# Patient Record
Sex: Female | Born: 1990
Health system: Southern US, Community
[De-identification: ages and names within clinical notes are randomized; demographics above are authoritative.]

## PROBLEM LIST (undated history)

## (undated) DIAGNOSIS — F32A Depression, unspecified: Secondary | ICD-10-CM

## (undated) DIAGNOSIS — F988 Other specified behavioral and emotional disorders with onset usually occurring in childhood and adolescence: Secondary | ICD-10-CM

## (undated) DIAGNOSIS — F329 Major depressive disorder, single episode, unspecified: Secondary | ICD-10-CM

## (undated) HISTORY — PX: DILATION AND CURETTAGE OF UTERUS: SHX78

## (undated) HISTORY — PX: WISDOM TOOTH EXTRACTION: SHX21

## (undated) HISTORY — DX: Other specified behavioral and emotional disorders with onset usually occurring in childhood and adolescence: F98.8

---

## 2011-07-24 ENCOUNTER — Emergency Department (INDEPENDENT_AMBULATORY_CARE_PROVIDER_SITE_OTHER): Payer: BC Managed Care – PPO

## 2011-07-24 ENCOUNTER — Emergency Department (HOSPITAL_BASED_OUTPATIENT_CLINIC_OR_DEPARTMENT_OTHER)
Admission: EM | Admit: 2011-07-24 | Discharge: 2011-07-24 | Disposition: A | Discharge status: Home / Self Care | Payer: BC Managed Care – PPO | Attending: Emergency Medicine | Admitting: Emergency Medicine

## 2011-07-24 ENCOUNTER — Emergency Department (HOSPITAL_BASED_OUTPATIENT_CLINIC_OR_DEPARTMENT_OTHER): Payer: BC Managed Care – PPO

## 2011-07-24 ENCOUNTER — Encounter: Payer: Self-pay | Admitting: *Deleted

## 2011-07-24 DIAGNOSIS — W19XXXA Unspecified fall, initial encounter: Secondary | ICD-10-CM

## 2011-07-24 DIAGNOSIS — J45909 Unspecified asthma, uncomplicated: Secondary | ICD-10-CM | POA: Insufficient documentation

## 2011-07-24 DIAGNOSIS — M25579 Pain in unspecified ankle and joints of unspecified foot: Secondary | ICD-10-CM

## 2011-07-24 DIAGNOSIS — S93409A Sprain of unspecified ligament of unspecified ankle, initial encounter: Secondary | ICD-10-CM | POA: Insufficient documentation

## 2011-07-24 DIAGNOSIS — Y92009 Unspecified place in unspecified non-institutional (private) residence as the place of occurrence of the external cause: Secondary | ICD-10-CM | POA: Insufficient documentation

## 2011-07-24 DIAGNOSIS — M79609 Pain in unspecified limb: Secondary | ICD-10-CM

## 2011-07-24 DIAGNOSIS — F17200 Nicotine dependence, unspecified, uncomplicated: Secondary | ICD-10-CM | POA: Insufficient documentation

## 2011-07-24 MED ORDER — HYDROCODONE-ACETAMINOPHEN 5-325 MG PO TABS: 1.0000 | ORAL_TABLET | Freq: Four times a day (QID) | ORAL | Status: AC | PRN

## 2011-07-24 MED ORDER — ONDANSETRON 8 MG PO TBDP
ORAL_TABLET | ORAL | Status: AC
  Filled 2011-07-24: qty 1

## 2011-07-24 MED ORDER — ONDANSETRON 8 MG PO TBDP
8.0000 mg | ORAL_TABLET | Freq: Once | ORAL | Status: AC
  Administered 2011-07-24: 8 mg via ORAL

## 2011-07-24 MED ORDER — HYDROCODONE-ACETAMINOPHEN 5-325 MG PO TABS
ORAL_TABLET | ORAL | Status: AC
  Filled 2011-07-24: qty 1

## 2011-07-24 MED ORDER — HYDROCODONE-ACETAMINOPHEN 5-325 MG PO TABS
1.0000 | ORAL_TABLET | Freq: Once | ORAL | Status: AC
  Administered 2011-07-24: 1 via ORAL

## 2011-07-24 NOTE — ED Notes (Signed)
Pt c/o left ankle pain from fall x 1 day ago

## 2011-07-24 NOTE — ED Provider Notes (Signed)
History     CSN: [id number]  Arrival date & time 07/24/11  1038   First MD Initiated Contact with Patient 07/24/11 1048      Chief Complaint  Patient presents with  . Ankle Pain    (Consider location/radiation/quality/duration/timing/severity/associated sxs/prior treatment) HPI Comments: Pt was helping her father paint.  She was standing ~ 4 feet up on a ladder.  She slipped and fell inverting L foot.  No other injuries.  Patient is a 20 y.o. female presenting with ankle pain. The history is provided by the patient. No language interpreter was used.  Ankle Pain  The incident occurred yesterday. The incident occurred at home. The injury mechanism was a fall. The pain is present in the left ankle and left foot. The quality of the pain is described as sharp. The pain is at a severity of 8/10. The pain has been constant since onset. Associated symptoms include inability to bear weight. She reports no foreign bodies present. The symptoms are aggravated by bearing weight and palpation. She has tried NSAIDs for the symptoms. The treatment provided no relief.    Past Medical History  Diagnosis Date  . Asthma     History reviewed. No pertinent past surgical history.  History reviewed. No pertinent family history.  History  Substance Use Topics  . Smoking status: Current Everyday Smoker -- 0.5 packs/day  . Smokeless tobacco: Not on file  . Alcohol Use: No    OB History    Grav Para Term Preterm Abortions TAB SAB Ect Mult Living                  Review of Systems  Musculoskeletal: Positive for joint swelling and gait problem.  All other systems reviewed and are negative.    Allergies  Amoxicillin  Home Medications   Current Outpatient Rx  Name Route Sig Dispense Refill  . ALBUTEROL SULFATE HFA 108 (90 BASE) MCG/ACT IN AERS Inhalation Inhale 2 puffs into the lungs every 6 (six) hours as needed.      Marland Kitchen CITALOPRAM HYDROBROMIDE 40 MG PO TABS Oral Take 40 mg by mouth daily.       Marland Kitchen CLONAZEPAM 1 MG PO TABS Oral Take 1 mg by mouth 2 (two) times daily as needed.      . IBUPROFEN 800 MG PO TABS Oral Take 800 mg by mouth every 8 (eight) hours as needed.        BP 113/76  Pulse 113  Temp(Src) 98.4 F (36.9 C) (Oral)  Ht 5\' 8"  (1.727 m)  Wt 160 lb (72.576 kg)  BMI 24.33 kg/m2  SpO2 100%  LMP 06/24/2011  Physical Exam  Nursing note and vitals reviewed. Constitutional: She is oriented to person, place, and time. She appears well-developed and well-nourished. No distress.  HENT:  Head: Normocephalic and atraumatic.  Eyes: EOM are normal.  Neck: Normal range of motion.  Cardiovascular: Normal rate, regular rhythm and normal heart sounds.   Pulmonary/Chest: Effort normal and breath sounds normal.  Abdominal: Soft. She exhibits no distension. There is no tenderness.  Musculoskeletal: She exhibits tenderness.       Left ankle: She exhibits decreased range of motion and swelling. She exhibits no ecchymosis, no deformity, no laceration and normal pulse. tenderness. Lateral malleolus tenderness found. No proximal fibula tenderness found.       Feet:  Neurological: She is alert and oriented to person, place, and time.  Skin: Skin is warm and dry. She is not diaphoretic.  Psychiatric:  She has a normal mood and affect. Judgment normal.    ED Course  Procedures (including critical care time)  Labs Reviewed - No data to display No results found.   No diagnosis found.    MDM         Worthy Rancher, PA 07/24/11 [phone]

## 2011-07-25 NOTE — ED Provider Notes (Signed)
Medical screening examination/treatment/procedure(s) were performed by non-physician practitioner and as supervising physician I was immediately available for consultation/collaboration.   Zandria Woldt A. Patrica Duel, MD 07/25/11 1452

## 2012-02-08 ENCOUNTER — Other Ambulatory Visit (HOSPITAL_COMMUNITY)
Admission: RE | Admit: 2012-02-08 | Discharge: 2012-02-08 | Disposition: A | Discharge status: Home / Self Care | Payer: BC Managed Care – PPO | Source: Ambulatory Visit | Attending: Obstetrics and Gynecology | Admitting: Obstetrics and Gynecology

## 2012-02-08 DIAGNOSIS — Z113 Encounter for screening for infections with a predominantly sexual mode of transmission: Secondary | ICD-10-CM | POA: Insufficient documentation

## 2012-02-08 DIAGNOSIS — Z01419 Encounter for gynecological examination (general) (routine) without abnormal findings: Secondary | ICD-10-CM | POA: Insufficient documentation

## 2012-02-08 DIAGNOSIS — N760 Acute vaginitis: Secondary | ICD-10-CM | POA: Insufficient documentation

## 2012-07-16 ENCOUNTER — Encounter (HOSPITAL_BASED_OUTPATIENT_CLINIC_OR_DEPARTMENT_OTHER): Payer: Self-pay | Admitting: *Deleted

## 2012-07-16 ENCOUNTER — Emergency Department (HOSPITAL_BASED_OUTPATIENT_CLINIC_OR_DEPARTMENT_OTHER)
Admission: EM | Admit: 2012-07-16 | Discharge: 2012-07-16 | Disposition: A | Discharge status: Home / Self Care | Payer: BC Managed Care – PPO | Attending: Emergency Medicine | Admitting: Emergency Medicine

## 2012-07-16 DIAGNOSIS — Z79899 Other long term (current) drug therapy: Secondary | ICD-10-CM | POA: Insufficient documentation

## 2012-07-16 DIAGNOSIS — Z3201 Encounter for pregnancy test, result positive: Secondary | ICD-10-CM | POA: Insufficient documentation

## 2012-07-16 DIAGNOSIS — O039 Complete or unspecified spontaneous abortion without complication: Secondary | ICD-10-CM | POA: Insufficient documentation

## 2012-07-16 DIAGNOSIS — F17200 Nicotine dependence, unspecified, uncomplicated: Secondary | ICD-10-CM | POA: Insufficient documentation

## 2012-07-16 DIAGNOSIS — Z791 Long term (current) use of non-steroidal anti-inflammatories (NSAID): Secondary | ICD-10-CM | POA: Insufficient documentation

## 2012-07-16 LAB — CBC
HCT: 38.4 % (ref 36.0–46.0)
MCHC: 36.7 g/dL — ABNORMAL HIGH (ref 30.0–36.0)
Platelets: 184 10*3/uL (ref 150–400)
RDW: 11.5 % (ref 11.5–15.5)
WBC: 9.6 10*3/uL (ref 4.0–10.5)

## 2012-07-16 LAB — WET PREP, GENITAL: Trich, Wet Prep: NONE SEEN

## 2012-07-16 LAB — URINALYSIS, ROUTINE W REFLEX MICROSCOPIC
Glucose, UA: NEGATIVE mg/dL
Leukocytes, UA: NEGATIVE
Protein, ur: NEGATIVE mg/dL
Specific Gravity, Urine: 1.011 (ref 1.005–1.030)
pH: 5.5 (ref 5.0–8.0)

## 2012-07-16 LAB — URINE MICROSCOPIC-ADD ON

## 2012-07-16 LAB — RH IG WORKUP (INCLUDES ABO/RH): Gestational Age(Wks): 6

## 2012-07-16 LAB — HCG, QUANTITATIVE, PREGNANCY: hCG, Beta Chain, Quant, S: 20461 m[IU]/mL — ABNORMAL HIGH (ref <5)

## 2012-07-16 LAB — PREGNANCY, URINE: Preg Test, Ur: POSITIVE — AB

## 2012-07-16 MED ORDER — HYDROCODONE-ACETAMINOPHEN 5-325 MG PO TABS: 1.0000 | ORAL_TABLET | Freq: Four times a day (QID) | ORAL | Status: DC | PRN

## 2012-07-16 NOTE — ED Provider Notes (Signed)
History   This chart was scribed for Brunelle Skene, MD by Sofie Rower, ED Scribe. The patient was seen in room MH09/MH09 and the patient's care was started at 6:00PM.     CSN: [id number]  Arrival date & time 07/16/12  1732   First MD Initiated Contact with Patient 07/16/12 1800      Chief Complaint  Patient presents with  . Abdominal Pain    (Consider location/radiation/quality/duration/timing/severity/associated sxs/prior treatment) The history is provided by the patient. No language interpreter was used.    Diana Ochoa is a 21 y.o. female , with a hx of 5.5 week pregnancy and ovarian cyst, who presents to the Emergency Department complaining of intermittent, progressively worsening abdominal pain located at the bilateral lower abdomen, onset today (07/16/12). Associated symptoms include nausea (onset two weeks ago), vaginal bleeding (onset today, 07/16/12), vaginal discharge, and back pain located at the lower back. The pt reports she is scheduled to have surgical abortion performed at [facility] center this Saturday (07/21/12), however, her vaginal bleeding prompted her concern and desire to seek medical evaluation at [facility] this evening.  The pt denies SOB, dizziness, chest pain, cough, fevers, chills, rash, arthralgias, ankle swelling, increased urinary frequency, and vomiting.  The pt is a current everyday smoker (0.5 packs/day), however, she does not drink alcohol. The pt does not know her blood type at present time.      Past Medical History  Diagnosis Date  . Asthma     History reviewed. No pertinent past surgical history.  History reviewed. No pertinent family history.  History  Substance Use Topics  . Smoking status: Current Every Day Smoker -- 0.5 packs/day    Types: Cigarettes  . Smokeless tobacco: Not on file  . Alcohol Use: No    OB History    Grav Para Term Preterm Abortions TAB SAB Ect Mult Living                  Review of Systems  At least  10pt or greater review of systems completed and are negative except where specified in the HPI.   Allergies  Abilify; Amoxicillin; and Lamictal  Home Medications   Current Outpatient Rx  Name  Route  Sig  Dispense  Refill  . ALBUTEROL SULFATE HFA 108 (90 BASE) MCG/ACT IN AERS   Inhalation   Inhale 2 puffs into the lungs every 6 (six) hours as needed.           Marland Kitchen CITALOPRAM HYDROBROMIDE 40 MG PO TABS   Oral   Take 40 mg by mouth daily.           Marland Kitchen CLONAZEPAM 1 MG PO TABS   Oral   Take 1 mg by mouth 2 (two) times daily as needed.           . IBUPROFEN 800 MG PO TABS   Oral   Take 800 mg by mouth every 8 (eight) hours as needed.             Pulse 103  Temp 98.8 F (37.1 C) (Oral)  Resp 16  Ht 5\' 7"  (1.702 m)  Wt 165 lb (74.844 kg)  BMI 25.84 kg/m2  SpO2 100%  LMP 05/25/2012  Physical Exam  Nursing notes reviewed.  Electronic medical record reviewed. VITAL SIGNS:   Filed Vitals:   07/16/12 1752 07/16/12 2132 07/16/12 2138  BP:  128/84 112/82  Pulse: 103 92 97  Temp: 98.8 F (37.1 C) 98.2 F (36.8 C)  TempSrc: Oral    Resp: 16 20 16   Height: 5\' 7"  (1.702 m)    Weight: 165 lb (74.844 kg)    SpO2: 100% 100% 100%   CONSTITUTIONAL: Awake, oriented, appears non-toxic HENT: Atraumatic, normocephalic, oral mucosa pink and moist, airway patent. Nares patent without drainage. External ears normal. EYES: Conjunctiva clear, EOMI, PERRLA NECK: Trachea midline, non-tender, supple CARDIOVASCULAR: Normal heart rate, Normal rhythm, No murmurs, rubs, gallops PULMONARY/CHEST: Clear to auscultation, no rhonchi, wheezes, or rales. Symmetrical breath sounds. Non-tender. ABDOMINAL: Non-distended, soft, non-tender - no rebound or guarding.  BS normal. NEUROLOGIC: Non-focal, moving all four extremities, no gross sensory or motor deficits. EXTREMITIES: No clubbing, cyanosis, or edema SKIN: Warm, Dry, No erythema, No rash PELVIC EXAM: normal external genitalia, vulva,  vagina, cervical os is opening with some dark brown blood oozing through it, there is dark blood in the vault, uterus is gravid, nontender to palpation no CMT and adnexa palpate normally.  ED Course  [place] bedside Performed by: [name] Authorized by: [name] Consent: Verbal consent obtained. Comments: Bedside transabdominal ultrasound shows a fetus low-lying in the uterus, heart rate is approximately 70-80 and appears as a parts of the products of conception are starting to pass through the cervix   (including critical care time)  DIAGNOSTIC STUDIES: Oxygen Saturation is 100% on room air, normal by my interpretation.    COORDINATION OF CARE:   6:27 PM- Treatment plan concerning pelvic exam and evaluation of blood type discussed with patient. Pt agrees with treatment.  8:47 PM- Pelvic exam performed. Chaperone present. Treatment plan discussed with patient. Pt agrees with treatment.       Results for orders placed during the hospital encounter of 07/16/12  URINALYSIS, ROUTINE W REFLEX MICROSCOPIC      Component Value Range   Color, Urine YELLOW  YELLOW   APPearance CLEAR  CLEAR   Specific Gravity, Urine 1.011  1.005 - 1.030   pH 5.5  5.0 - 8.0   Glucose, UA NEGATIVE  NEGATIVE mg/dL   Hgb urine dipstick LARGE (*) NEGATIVE   Bilirubin Urine NEGATIVE  NEGATIVE   Ketones, ur NEGATIVE  NEGATIVE mg/dL   Protein, ur NEGATIVE  NEGATIVE mg/dL   Urobilinogen, UA 0.2  0.0 - 1.0 mg/dL   Nitrite NEGATIVE  NEGATIVE   Leukocytes, UA NEGATIVE  NEGATIVE  PREGNANCY, URINE      Component Value Range   Preg Test, Ur POSITIVE (*) NEGATIVE  RH IG WORKUP (INCLUDES ABO/RH)      Component Value Range   Gestational Age(Wks) 6     ABO/RH(D) A POS     No rh immune globuloin NOT A RH IMMUNE GLOBULIN CANDIDATE, PT RH POSITIVE    CBC      Component Value Range   WBC 9.6  4.0 - 10.5 K/uL   RBC 4.45  3.87 - 5.11 MIL/uL   Hemoglobin 14.1  12.0 - 15.0 g/dL   HCT 16.1  09.6 - 04.5 %    MCV 86.3  78.0 - 100.0 fL   MCH 31.7  26.0 - 34.0 pg   MCHC 36.7 (*) 30.0 - 36.0 g/dL   RDW 40.9  81.1 - 91.4 %   Platelets 184  150 - 400 K/uL  WET PREP, GENITAL      Component Value Range   Yeast Wet Prep HPF POC NONE SEEN  NONE SEEN   Trich, Wet Prep NONE SEEN  NONE SEEN   Clue Cells Wet Prep HPF POC NONE  SEEN  NONE SEEN   WBC, Wet Prep HPF POC NONE SEEN  NONE SEEN  URINE MICROSCOPIC-ADD ON      Component Value Range   Squamous Epithelial / LPF RARE  RARE   RBC / HPF TOO NUMEROUS TO COUNT  <3 RBC/hpf   Bacteria, UA RARE  RARE      No results found.   1. Inevitable abortion       MDM  Xiadani Damman is a 21 y.o. female presents with symptoms concerning for inevitable abortion. Patient was planning on a therapeutic abortion in a week.  Pelvic exam shows the patient's os is opening, there is a dark blood oozing through a freely.  Bedside ultrasound, the fetal heart rate is low in the 70s to 80s, the fetus is actually beginning to pass through the cervix at this point.  Patient is hemodynamically stable, she is a positive and will not require RhoGAM.  Given the patient precautions on when to return to the emergency department, including worsening bleeding, uncontrolled bleeding, bright red bleeding, any symptoms of severe anemia such as lightheadedness, dizziness, chest pain or shortness of breath - no have the patient followup with her obstetrician. If the patient should fail to pass the products of conception within the next day she is to return to the emergency department if she cannot get an appointment with her obstetrician.  Also cautioned her against a septic abortion and to return for fevers, abdominal pain, or any other concerning symptoms.  Patient understands and agrees with medical plan as it's been dictated will be discharged stable and in good condition   I personally performed the services described in this documentation, which was scribed in my presence. The  recorded information has been reviewed and is accurate. Weist Ochoa, M.D.     Diana Skene, MD 07/22/12 1610

## 2012-07-16 NOTE — ED Notes (Signed)
MD at bedside. 

## 2012-07-16 NOTE — ED Notes (Signed)
Pt c/o lower back pain x 1 day with bright red vaginal bleeding pt is 5 weeks preg,

## 2012-07-16 NOTE — ED Notes (Signed)
Pt reports large amount of bleeding earlier (bright red blood) in day, bleeding has stopped now, cont. To report pelvic pain, denies pain with urination

## 2012-07-17 LAB — GC/CHLAMYDIA PROBE AMP: GC Probe RNA: NEGATIVE

## 2012-07-19 ENCOUNTER — Ambulatory Visit (HOSPITAL_COMMUNITY)
Admission: RE | Admit: 2012-07-19 | Discharge: 2012-07-19 | Disposition: A | Discharge status: Home / Self Care | Payer: BC Managed Care – PPO | Source: Ambulatory Visit | Attending: Obstetrics and Gynecology | Admitting: Obstetrics and Gynecology

## 2012-07-19 ENCOUNTER — Other Ambulatory Visit (HOSPITAL_COMMUNITY): Payer: Self-pay | Admitting: Obstetrics and Gynecology

## 2012-07-19 DIAGNOSIS — O3680X0 Pregnancy with inconclusive fetal viability, not applicable or unspecified: Secondary | ICD-10-CM

## 2012-07-19 DIAGNOSIS — O9989 Other specified diseases and conditions complicating pregnancy, childbirth and the puerperium: Secondary | ICD-10-CM | POA: Insufficient documentation

## 2012-07-19 DIAGNOSIS — O4690 Antepartum hemorrhage, unspecified, unspecified trimester: Secondary | ICD-10-CM | POA: Insufficient documentation

## 2012-09-27 ENCOUNTER — Emergency Department (HOSPITAL_COMMUNITY)
Admission: EM | Admit: 2012-09-27 | Discharge: 2012-09-27 | Disposition: A | Discharge status: Home / Self Care | Payer: BC Managed Care – PPO | Attending: Emergency Medicine | Admitting: Emergency Medicine

## 2012-09-27 ENCOUNTER — Encounter (HOSPITAL_COMMUNITY): Payer: Self-pay | Admitting: *Deleted

## 2012-09-27 DIAGNOSIS — Z79899 Other long term (current) drug therapy: Secondary | ICD-10-CM | POA: Insufficient documentation

## 2012-09-27 DIAGNOSIS — IMO0002 Reserved for concepts with insufficient information to code with codable children: Secondary | ICD-10-CM | POA: Insufficient documentation

## 2012-09-27 DIAGNOSIS — K920 Hematemesis: Secondary | ICD-10-CM

## 2012-09-27 DIAGNOSIS — R112 Nausea with vomiting, unspecified: Secondary | ICD-10-CM

## 2012-09-27 DIAGNOSIS — E860 Dehydration: Secondary | ICD-10-CM | POA: Insufficient documentation

## 2012-09-27 DIAGNOSIS — F17200 Nicotine dependence, unspecified, uncomplicated: Secondary | ICD-10-CM | POA: Insufficient documentation

## 2012-09-27 DIAGNOSIS — F3289 Other specified depressive episodes: Secondary | ICD-10-CM | POA: Insufficient documentation

## 2012-09-27 DIAGNOSIS — F329 Major depressive disorder, single episode, unspecified: Secondary | ICD-10-CM | POA: Insufficient documentation

## 2012-09-27 DIAGNOSIS — R5381 Other malaise: Secondary | ICD-10-CM | POA: Insufficient documentation

## 2012-09-27 DIAGNOSIS — J45909 Unspecified asthma, uncomplicated: Secondary | ICD-10-CM | POA: Insufficient documentation

## 2012-09-27 DIAGNOSIS — K2970 Gastritis, unspecified, without bleeding: Secondary | ICD-10-CM

## 2012-09-27 DIAGNOSIS — Z791 Long term (current) use of non-steroidal anti-inflammatories (NSAID): Secondary | ICD-10-CM | POA: Insufficient documentation

## 2012-09-27 DIAGNOSIS — K2901 Acute gastritis with bleeding: Secondary | ICD-10-CM | POA: Insufficient documentation

## 2012-09-27 HISTORY — DX: Major depressive disorder, single episode, unspecified: F32.9

## 2012-09-27 HISTORY — DX: Depression, unspecified: F32.A

## 2012-09-27 LAB — COMPREHENSIVE METABOLIC PANEL
ALT: 24 U/L (ref 0–35)
AST: 22 U/L (ref 0–37)
CO2: 21 mEq/L (ref 19–32)
Calcium: 9.4 mg/dL (ref 8.4–10.5)
Chloride: 102 mEq/L (ref 96–112)
GFR calc Af Amer: >90 mL/min (ref >90)
GFR calc non Af Amer: >90 mL/min (ref >90)
Sodium: 137 mEq/L (ref 135–145)

## 2012-09-27 LAB — CBC WITH DIFFERENTIAL/PLATELET
Basophils Absolute: 0 10*3/uL (ref 0.0–0.1)
Eosinophils Relative: 4 % (ref 0–5)
Lymphocytes Relative: 18 % (ref 12–46)
Neutro Abs: 3.7 10*3/uL (ref 1.7–7.7)
Neutrophils Relative %: 68 % (ref 43–77)
Platelets: 192 10*3/uL (ref 150–400)
RDW: 12.1 % (ref 11.5–15.5)
WBC: 5.4 10*3/uL (ref 4.0–10.5)

## 2012-09-27 LAB — LIPASE, BLOOD: Lipase: 17 U/L (ref 11–59)

## 2012-09-27 MED ORDER — METOCLOPRAMIDE HCL 5 MG/ML IJ SOLN
10.0000 mg | Freq: Once | INTRAMUSCULAR | Status: AC
  Administered 2012-09-27: 10 mg via INTRAVENOUS
  Filled 2012-09-27 (×2): qty 2

## 2012-09-27 MED ORDER — DIPHENHYDRAMINE HCL 50 MG/ML IJ SOLN
25.0000 mg | Freq: Once | INTRAMUSCULAR | Status: AC
Start: 2012-09-27 — End: 2012-09-27
  Administered 2012-09-27: 25 mg via INTRAVENOUS
  Filled 2012-09-27: qty 1

## 2012-09-27 MED ORDER — SODIUM CHLORIDE 0.9 % IV SOLN: 1000.0000 mL | Freq: Once | INTRAVENOUS | Status: DC

## 2012-09-27 MED ORDER — SODIUM CHLORIDE 0.9 % IV SOLN: 1000.0000 mL | INTRAVENOUS | Status: DC

## 2012-09-27 MED ORDER — ONDANSETRON 8 MG PO TBDP: 8.0000 mg | ORAL_TABLET | Freq: Three times a day (TID) | ORAL | Status: DC | PRN

## 2012-09-27 MED ORDER — SODIUM CHLORIDE 0.9 % IV SOLN
1000.0000 mL | Freq: Once | INTRAVENOUS | Status: AC
  Administered 2012-09-27: 1000 mL via INTRAVENOUS

## 2012-09-27 MED ORDER — FAMOTIDINE IN NACL 20-0.9 MG/50ML-% IV SOLN
20.0000 mg | Freq: Once | INTRAVENOUS | Status: AC
  Administered 2012-09-27: 20 mg via INTRAVENOUS
  Filled 2012-09-27: qty 50

## 2012-09-27 NOTE — ED Provider Notes (Signed)
History     CSN: [id number]  Arrival date & time 09/27/12  7829   First MD Initiated Contact with Patient 09/27/12 [phone]      Chief Complaint  Patient presents with  . Abdominal Pain  . Nausea  . Emesis    (Consider location/radiation/quality/duration/timing/severity/associated sxs/prior treatment) HPI  Patient reports she had worked all day yesterday. About 7:30 PM she was cooking and drank one beer but did not eat. She states she went to take a shower and about a half hour after drinking the beer she started having nausea and epigastric pain. She states she started having vomiting and about 3 time she saw about a teaspoon of blood in the vomitus. She states she's vomited about 10 times. Last time she vomited was about 6 AM and there was no blood. The epigastric pain is described as burning and stabbing without radiation. She still has some nausea. She denies any diarrhea. She states she feels weak but denies feeling dizziness or lightheadedness. She states during the night she took Prilosec, TUMS, and Zegerid however she vomited them up. She states she briefly had some improvement with the Tums before she vomited it. She states she's had this before over the past 6 months at least 4-5 times.  Patient smokes about one pack per week. She states she drinks 2-3 times a week and drinks 3 or 4 beers at a time. She also reports she takes ibuprofen about 400 mg 3 times a day especially for menstrual cramping. She has not had any in the past week.  PCP PA Delice Lesch at [facility]  Past Medical History  Diagnosis Date  . Asthma   . Depression     Past Surgical History  Procedure Laterality Date  . Dilation and curettage of uterus    . Wisdom tooth extraction      No family history on file.  History  Substance Use Topics  . Smoking status: Current Some Day Smoker -- 0.50 packs/day    Types: Cigarettes  . Smokeless tobacco: Not on file  . Alcohol Use: Yes     Comment: 2-3x/week    employed at [employer]  OB History   Grav Para Term Preterm Abortions TAB SAB Ect Mult Living                  Review of Systems  All other systems reviewed and are negative.    Allergies  Abilify; Amoxicillin; and Lamictal  Home Medications   Current Outpatient Rx  Name  Route  Sig  Dispense  Refill  . albuterol (PROVENTIL HFA;VENTOLIN HFA) 108 (90 BASE) MCG/ACT inhaler   Inhalation   Inhale 2 puffs into the lungs every 6 (six) hours as needed for wheezing or shortness of breath.          Marland Kitchen buPROPion (WELLBUTRIN XL) 150 MG 24 hr tablet   Oral   Take 150 mg by mouth daily before breakfast.         . clonazePAM (KLONOPIN) 1 MG tablet   Oral   Take 1 mg by mouth at bedtime as needed for anxiety (sleep).          . Fluticasone-Salmeterol (ADVAIR) 250-50 MCG/DOSE AEPB   Inhalation   Inhale 1 puff into the lungs every 12 (twelve) hours.         Marland Kitchen guaiFENesin (MUCINEX) 600 MG 12 hr tablet   Oral   Take 1,200 mg by mouth 2 (two) times daily.         Marland Kitchen  ibuprofen (ADVIL,MOTRIN) 200 MG tablet   Oral   Take 600 mg by mouth every 6 (six) hours as needed for pain.         Marland Kitchen lisdexamfetamine (VYVANSE) 50 MG capsule   Oral   Take 50 mg by mouth daily before breakfast.         . omeprazole (PRILOSEC OTC) 20 MG tablet   Oral   Take 20 mg by mouth daily.           BP 140/89  Pulse 94  Temp(Src) 99.1 F (37.3 C) (Oral)  Resp 14  SpO2 98%  LMP 08/31/2012  Vital signs normal except low-grade temp   Physical Exam  Nursing note and vitals reviewed. Constitutional: She is oriented to person, place, and time. She appears well-developed and well-nourished.  Non-toxic appearance. She does not appear ill. No distress.  HENT:  Head: Normocephalic and atraumatic.  Right Ear: External ear normal.  Left Ear: External ear normal.  Nose: Nose normal. No mucosal edema or rhinorrhea.  Mouth/Throat: Mucous membranes are normal. No dental abscesses or  edematous.  Dry tongue  Eyes: Conjunctivae and EOM are normal. Pupils are equal, round, and reactive to light.  Neck: Normal range of motion and full passive range of motion without pain. Neck supple.  Cardiovascular: Normal rate, regular rhythm and normal heart sounds.  Exam reveals no gallop and no friction rub.   No murmur heard. Pulmonary/Chest: Effort normal and breath sounds normal. No respiratory distress. She has no wheezes. She has no rhonchi. She has no rales. She exhibits no tenderness and no crepitus.  Abdominal: Soft. Normal appearance and bowel sounds are normal. She exhibits no distension. There is tenderness. There is no rebound and no guarding.    Musculoskeletal: Normal range of motion. She exhibits no edema and no tenderness.  Moves all extremities well.   Neurological: She is alert and oriented to person, place, and time. She has normal strength. No cranial nerve deficit.  Skin: Skin is warm, dry and intact. No rash noted. No erythema. No pallor.  Psychiatric: Her speech is normal and behavior is normal. Her mood appears not anxious.  Flat affect tearful at times    ED Course  Procedures (including critical care time)  Medications  0.9 %  sodium chloride infusion (0 mLs Intravenous Stopped 09/27/12 1008)    Followed by  0.9 %  sodium chloride infusion (not administered)    Followed by  0.9 %  sodium chloride infusion (not administered)  famotidine (PEPCID) IVPB 20 mg (20 mg Intravenous New Bag/Given 09/27/12 0850)  metoCLOPramide (REGLAN) injection 10 mg (10 mg Intravenous Given 09/27/12 0846)  diphenhydrAMINE (BENADRYL) injection 25 mg (25 mg Intravenous Given 09/27/12 0841)   Recheck at 10:00 states she is feeling a lot better, nausea and pain are gone. We discussed stopping smoking, alcohol and ibuprofen for now. She works at [employer] and is going to get an appointment to have EDG done as an outpatient.   Results for orders placed during the hospital encounter of  09/27/12  CBC WITH DIFFERENTIAL      Result Value Range   WBC 5.4  4.0 - 10.5 K/uL   RBC 4.61  3.87 - 5.11 MIL/uL   Hemoglobin 14.2  12.0 - 15.0 g/dL   HCT 09.8  11.9 - 14.7 %   MCV 85.7  78.0 - 100.0 fL   MCH 30.8  26.0 - 34.0 pg   MCHC 35.9  30.0 - 36.0 g/dL  RDW 12.1  11.5 - 15.5 %   Platelets 192  150 - 400 K/uL   Neutrophils Relative 68  43 - 77 %   Neutro Abs 3.7  1.7 - 7.7 K/uL   Lymphocytes Relative 18  12 - 46 %   Lymphs Abs 1.0  0.7 - 4.0 K/uL   Monocytes Relative 10  3 - 12 %   Monocytes Absolute 0.5  0.1 - 1.0 K/uL   Eosinophils Relative 4  0 - 5 %   Eosinophils Absolute 0.2  0.0 - 0.7 K/uL   Basophils Relative 0  0 - 1 %   Basophils Absolute 0.0  0.0 - 0.1 K/uL  COMPREHENSIVE METABOLIC PANEL      Result Value Range   Sodium 137  135 - 145 mEq/L   Potassium 4.2  3.5 - 5.1 mEq/L   Chloride 102  96 - 112 mEq/L   CO2 21  19 - 32 mEq/L   Glucose, Bld 81  70 - 99 mg/dL   BUN 10  6 - 23 mg/dL   Creatinine, Ser 1.61  0.50 - 1.10 mg/dL   Calcium 9.4  8.4 - 09.6 mg/dL   Total Protein 7.7  6.0 - 8.3 g/dL   Albumin 4.2  3.5 - 5.2 g/dL   AST 22  0 - 37 U/L   ALT 24  0 - 35 U/L   Alkaline Phosphatase 48  39 - 117 U/L   Total Bilirubin 1.3 (*) 0.3 - 1.2 mg/dL   GFR calc non Af Amer >90  >90 mL/min   GFR calc Af Amer >90  >90 mL/min  LIPASE, BLOOD      Result Value Range   Lipase 17  11 - 59 U/L   Laboratory interpretation all normal except      1. Gastritis   2. Nausea and vomiting   3. Hematemesis    New Prescriptions   ONDANSETRON (ZOFRAN ODT) 8 MG DISINTEGRATING TABLET    Take 1 tablet (8 mg total) by mouth every 8 (eight) hours as needed for nausea.  prilosec OTC  Plan discharge  Devoria Albe, MD, FACEP     MDM          Ward Givens, MD 09/27/12 1009

## 2012-09-27 NOTE — ED Notes (Signed)
Pt reports epigastric pain since 730 last night. Had one etoh drink last night before pain started. Has tried prilosec, tums without relief. Sts she is unable to keep anything down. Sts she began seeing bright red blood in vomit this am. Sts Hx of same in past.

## 2012-09-27 NOTE — ED Notes (Signed)
Patient made aware urine specimen is needed. Will attempt to collect after first IV Bolus

## 2012-10-04 ENCOUNTER — Other Ambulatory Visit: Payer: Self-pay | Admitting: Family Medicine

## 2012-10-04 ENCOUNTER — Ambulatory Visit
Admission: RE | Admit: 2012-10-04 | Discharge: 2012-10-04 | Disposition: A | Discharge status: Home / Self Care | Payer: BC Managed Care – PPO | Source: Ambulatory Visit | Attending: Family Medicine | Admitting: Family Medicine

## 2012-10-04 DIAGNOSIS — R112 Nausea with vomiting, unspecified: Secondary | ICD-10-CM

## 2012-10-04 DIAGNOSIS — R109 Unspecified abdominal pain: Secondary | ICD-10-CM

## 2013-01-14 ENCOUNTER — Encounter (HOSPITAL_BASED_OUTPATIENT_CLINIC_OR_DEPARTMENT_OTHER): Payer: Self-pay | Admitting: *Deleted

## 2013-01-14 ENCOUNTER — Emergency Department (HOSPITAL_BASED_OUTPATIENT_CLINIC_OR_DEPARTMENT_OTHER)
Admission: EM | Admit: 2013-01-14 | Discharge: 2013-01-15 | Disposition: A | Discharge status: Home / Self Care | Payer: BC Managed Care – PPO | Attending: Emergency Medicine | Admitting: Emergency Medicine

## 2013-01-14 DIAGNOSIS — W5911XA Bitten by nonvenomous snake, initial encounter: Secondary | ICD-10-CM

## 2013-01-14 DIAGNOSIS — Y9301 Activity, walking, marching and hiking: Secondary | ICD-10-CM | POA: Insufficient documentation

## 2013-01-14 DIAGNOSIS — T63121A Toxic effect of venom of other venomous lizard, accidental (unintentional), initial encounter: Secondary | ICD-10-CM | POA: Insufficient documentation

## 2013-01-14 DIAGNOSIS — IMO0002 Reserved for concepts with insufficient information to code with codable children: Secondary | ICD-10-CM | POA: Insufficient documentation

## 2013-01-14 DIAGNOSIS — F17200 Nicotine dependence, unspecified, uncomplicated: Secondary | ICD-10-CM | POA: Insufficient documentation

## 2013-01-14 DIAGNOSIS — Y9289 Other specified places as the place of occurrence of the external cause: Secondary | ICD-10-CM | POA: Insufficient documentation

## 2013-01-14 DIAGNOSIS — Z880 Allergy status to penicillin: Secondary | ICD-10-CM | POA: Insufficient documentation

## 2013-01-14 DIAGNOSIS — T63001A Toxic effect of unspecified snake venom, accidental (unintentional), initial encounter: Secondary | ICD-10-CM | POA: Insufficient documentation

## 2013-01-14 DIAGNOSIS — S91309A Unspecified open wound, unspecified foot, initial encounter: Secondary | ICD-10-CM | POA: Insufficient documentation

## 2013-01-14 DIAGNOSIS — Z79899 Other long term (current) drug therapy: Secondary | ICD-10-CM | POA: Insufficient documentation

## 2013-01-14 DIAGNOSIS — Z23 Encounter for immunization: Secondary | ICD-10-CM | POA: Insufficient documentation

## 2013-01-14 MED ORDER — HYDROCODONE-ACETAMINOPHEN 5-325 MG PO TABS
2.0000 | ORAL_TABLET | Freq: Once | ORAL | Status: AC
  Administered 2013-01-14: 2 via ORAL
  Filled 2013-01-14: qty 2

## 2013-01-14 MED ORDER — ONDANSETRON HCL 4 MG/2ML IJ SOLN
4.0000 mg | Freq: Once | INTRAMUSCULAR | Status: AC
  Administered 2013-01-15: 4 mg via INTRAVENOUS
  Filled 2013-01-14: qty 2

## 2013-01-14 MED ORDER — TETANUS-DIPHTH-ACELL PERTUSSIS 5-2.5-18.5 LF-MCG/0.5 IM SUSP
0.5000 mL | Freq: Once | INTRAMUSCULAR | Status: AC
  Administered 2013-01-14: 0.5 mL via INTRAMUSCULAR
  Filled 2013-01-14: qty 0.5

## 2013-01-14 MED ORDER — MORPHINE SULFATE 4 MG/ML IJ SOLN
4.0000 mg | Freq: Once | INTRAMUSCULAR | Status: AC
  Administered 2013-01-15: 4 mg via INTRAVENOUS
  Filled 2013-01-14: qty 1

## 2013-01-14 NOTE — ED Notes (Signed)
Drew with skin marker around the Pt. Snake bite on the L outer foot.

## 2013-01-14 NOTE — ED Notes (Signed)
Pt was walking in flip flops in the yard when she felt something bite her left outer foot, 4 puncture wounds noted with swelling.

## 2013-01-14 NOTE — ED Provider Notes (Signed)
History    CSN: [id number] Arrival date & time 01/14/13  2154  First MD Initiated Contact with Patient 01/14/13 2211     Chief Complaint  Patient presents with  . Snake Bite   (Consider location/radiation/quality/duration/timing/severity/associated sxs/prior Treatment) HPI Comments: Pt states that she was walking outside and she felt a burning sensation to her foot:pt states that when she came in she noted 4 puncture marks to her foot  Patient is a 22 y.o. female presenting with animal bite. The history is provided by the patient. No language interpreter was used.  Animal Bite Contact animal:  Snake Location:  Foot Foot injury location:  Top of L foot Pain details:    Quality:  Aching   Severity:  Mild   Timing:  Constant   Progression:  Unchanged Incident location:  Outside Tetanus status:  Unknown  Past Medical History  Diagnosis Date  . Asthma   . Depression    Past Surgical History  Procedure Laterality Date  . Dilation and curettage of uterus    . Wisdom tooth extraction     History reviewed. No pertinent family history. History  Substance Use Topics  . Smoking status: Current Some Day Smoker -- 0.50 packs/day    Types: Cigarettes  . Smokeless tobacco: Not on file  . Alcohol Use: Yes     Comment: 2-3x/week   OB History   Grav Para Term Preterm Abortions TAB SAB Ect Mult Living                 Review of Systems  Constitutional: Negative.   Respiratory: Negative.   Cardiovascular: Negative.     Allergies  Abilify; Amoxicillin; and Lamictal  Home Medications   Current Outpatient Rx  Name  Route  Sig  Dispense  Refill  . albuterol (PROVENTIL HFA;VENTOLIN HFA) 108 (90 BASE) MCG/ACT inhaler   Inhalation   Inhale 2 puffs into the lungs every 6 (six) hours as needed for wheezing or shortness of breath.          . clonazePAM (KLONOPIN) 1 MG tablet   Oral   Take 1 mg by mouth at bedtime as needed for anxiety (sleep).          Marland Kitchen guaiFENesin  (MUCINEX) 600 MG 12 hr tablet   Oral   Take 1,200 mg by mouth 2 (two) times daily.         Marland Kitchen ibuprofen (ADVIL,MOTRIN) 200 MG tablet   Oral   Take 600 mg by mouth every 6 (six) hours as needed for pain.         Marland Kitchen lisdexamfetamine (VYVANSE) 50 MG capsule   Oral   Take 50 mg by mouth daily before breakfast.         . buPROPion (WELLBUTRIN XL) 150 MG 24 hr tablet   Oral   Take 150 mg by mouth daily before breakfast.         . Fluticasone-Salmeterol (ADVAIR) 250-50 MCG/DOSE AEPB   Inhalation   Inhale 1 puff into the lungs every 12 (twelve) hours.         Marland Kitchen omeprazole (PRILOSEC OTC) 20 MG tablet   Oral   Take 20 mg by mouth daily.         . ondansetron (ZOFRAN ODT) 8 MG disintegrating tablet   Oral   Take 1 tablet (8 mg total) by mouth every 8 (eight) hours as needed for nausea.   6 tablet   0    BP 139/92  Pulse 128  Temp(Src) 98.6 F (37 C) (Oral)  Ht 5\' 7"  (1.702 m)  Wt 154 lb (69.854 kg)  BMI 24.11 kg/m2  SpO2 98% Physical Exam  Nursing note and vitals reviewed. Constitutional: She is oriented to person, place, and time. She appears well-developed and well-nourished.  HENT:  Head: Normocephalic and atraumatic.  Eyes: Conjunctivae are normal.  Neck: Normal range of motion. Neck supple.  Cardiovascular: Normal rate and regular rhythm.   Pulmonary/Chest: Effort normal and breath sounds normal.  Musculoskeletal: Normal range of motion.  Neurological: She is alert and oriented to person, place, and time.  Skin:  Pt has 4 puncture wounds to the top of the left foot with some bruising noted:pt has swelling noted to the foot:pulses intact    ED Course  Procedures (including critical care time) Labs Reviewed  CBC WITH DIFFERENTIAL - Abnormal; Notable for the following:    Neutrophils Relative % 78 (*)    All other components within normal limits  COMPREHENSIVE METABOLIC PANEL - Abnormal; Notable for the following:    Sodium 134 (*)    Potassium 3.1 (*)     Glucose, Bld 104 (*)    All other components within normal limits  PROTIME-INR  APTT  D-DIMER, QUANTITATIVE  FIBRINOGEN   No results found. No diagnosis found.  MDM  Spoke with poison control and got reccomendations 12:48 AM Pt has had no swelling over the last hour:pt waiting on fibrinogen:pt is going to follow by Dr. Elayne Snare, NP 01/15/13 [phone]

## 2013-01-15 LAB — CBC WITH DIFFERENTIAL/PLATELET
Basophils Absolute: 0 10*3/uL (ref 0.0–0.1)
Basophils Relative: 0 % (ref 0–1)
HCT: 41.4 % (ref 36.0–46.0)
Hemoglobin: 14.9 g/dL (ref 12.0–15.0)
Lymphocytes Relative: 15 % (ref 12–46)
MCHC: 36 g/dL (ref 30.0–36.0)
Neutro Abs: 6.6 10*3/uL (ref 1.7–7.7)
Neutrophils Relative %: 78 % — ABNORMAL HIGH (ref 43–77)
RDW: 11.9 % (ref 11.5–15.5)
WBC: 8.5 10*3/uL (ref 4.0–10.5)

## 2013-01-15 LAB — COMPREHENSIVE METABOLIC PANEL
ALT: 26 U/L (ref 0–35)
AST: 21 U/L (ref 0–37)
Albumin: 4.6 g/dL (ref 3.5–5.2)
Alkaline Phosphatase: 60 U/L (ref 39–117)
CO2: 26 mEq/L (ref 19–32)
Chloride: 96 mEq/L (ref 96–112)
GFR calc non Af Amer: >90 mL/min (ref >90)
Potassium: 3.1 mEq/L — ABNORMAL LOW (ref 3.5–5.1)
Total Bilirubin: 0.6 mg/dL (ref 0.3–1.2)

## 2013-01-15 LAB — FIBRINOGEN: Fibrinogen: 244 mg/dL (ref 204–475)

## 2013-01-15 MED ORDER — KETOROLAC TROMETHAMINE 30 MG/ML IJ SOLN
30.0000 mg | Freq: Once | INTRAMUSCULAR | Status: AC
  Administered 2013-01-15: 30 mg via INTRAVENOUS
  Filled 2013-01-15: qty 1

## 2013-01-15 MED ORDER — OXYCODONE-ACETAMINOPHEN 5-325 MG PO TABS: 1.0000 | ORAL_TABLET | Freq: Four times a day (QID) | ORAL | Status: DC | PRN

## 2013-01-15 NOTE — ED Notes (Signed)
rec'd call from lab tech stating that blood specimen had just been picked up by courier, MD aware.

## 2013-01-15 NOTE — ED Provider Notes (Signed)
Prior to discharge at the 7 hours post bite mark.    Swelling of the left foot has stayed within marked border from 2345.  No worsening ecchymosis or swelling no streaking.  FROM of all toes, motor and sensation intact to all nerve distributions of the left foot.  Left foot neurovascularly intact dorsalis pedis 2+ posterior tibial pulse 2+.  Tachycardia has resolved post pain medication.  All other vitals have remained normal.    All labs reviewed and [employer] control recontacted at 307 am and EDP reviewed all labs and patient exam reviewed via phone.  Based on labs and exam, Poison Control states no indication for Crofab nor admission at this time.  Based on [facility] snake envenomation set there is no indication for crofab or admission at this time.     Patient advised to elevate the leg, not to use compressive dressing.  We will provide oral pain medications.  Patient advised Poison control will follow up with her in the afternoon.  Return immediately for fevers > 101, streaking up the leg, worsening swelling up the leg or worsening ecchymosis, vomiting, chest tightness difficulty breathing or any concerns.  Patient verbalizes understanding and agrees to follow up  Aneita Kiger Smitty Cords, MD 01/15/13 [phone]

## 2013-01-15 NOTE — ED Notes (Signed)
Pt states that she has had toradol in the past and it doesn't work for her. Once again encouraged pt to elevate foot to help decrease swelling. Pt medicated per MD orders and updated on delays.

## 2013-01-15 NOTE — ED Notes (Signed)
Pt. Edema around snake bite has become less and Pt. Pain under control after morphine given.  Pt. In no distress and no tearful expressions.  Pt. Is comfortable and resting with friend at bedside.

## 2013-01-15 NOTE — ED Notes (Signed)
Pt. Called RN back into room to let RN know as Pt. Stated " I am still in pain, this hurts worse than any pain I have ever had."

## 2013-01-15 NOTE — ED Notes (Signed)
MD at bedside. 

## 2013-01-17 NOTE — ED Provider Notes (Signed)
Medical screening examination/treatment/procedure(s) were conducted as a shared visit with non-physician practitioner(s) and myself.  I personally evaluated the patient during the encounter   Rolan Bucco, MD 01/17/13 [phone]

## 2013-01-22 ENCOUNTER — Emergency Department (HOSPITAL_COMMUNITY)
Admission: EM | Admit: 2013-01-22 | Discharge: 2013-01-23 | Disposition: A | Discharge status: Home / Self Care | Payer: BC Managed Care – PPO | Attending: Emergency Medicine | Admitting: Emergency Medicine

## 2013-01-22 ENCOUNTER — Encounter (HOSPITAL_COMMUNITY): Payer: Self-pay | Admitting: Emergency Medicine

## 2013-01-22 DIAGNOSIS — F17200 Nicotine dependence, unspecified, uncomplicated: Secondary | ICD-10-CM | POA: Insufficient documentation

## 2013-01-22 DIAGNOSIS — M25579 Pain in unspecified ankle and joints of unspecified foot: Secondary | ICD-10-CM | POA: Diagnosis present

## 2013-01-22 DIAGNOSIS — F3289 Other specified depressive episodes: Secondary | ICD-10-CM | POA: Insufficient documentation

## 2013-01-22 DIAGNOSIS — T63001A Toxic effect of unspecified snake venom, accidental (unintentional), initial encounter: Secondary | ICD-10-CM

## 2013-01-22 DIAGNOSIS — J45909 Unspecified asthma, uncomplicated: Secondary | ICD-10-CM | POA: Insufficient documentation

## 2013-01-22 DIAGNOSIS — M25572 Pain in left ankle and joints of left foot: Secondary | ICD-10-CM

## 2013-01-22 DIAGNOSIS — F329 Major depressive disorder, single episode, unspecified: Secondary | ICD-10-CM | POA: Insufficient documentation

## 2013-01-22 DIAGNOSIS — Z79899 Other long term (current) drug therapy: Secondary | ICD-10-CM | POA: Insufficient documentation

## 2013-01-22 LAB — CBC
Hemoglobin: 14.3 g/dL (ref 12.0–15.0)
Platelets: 221 10*3/uL (ref 150–400)
RBC: 4.56 MIL/uL (ref 3.87–5.11)
WBC: 7.7 10*3/uL (ref 4.0–10.5)

## 2013-01-22 LAB — PROTIME-INR
INR: 1.04 (ref 0.00–1.49)
Prothrombin Time: 13.4 seconds (ref 11.6–15.2)

## 2013-01-22 MED ORDER — HYDROCODONE-ACETAMINOPHEN 5-325 MG PO TABS: 1.0000 | ORAL_TABLET | ORAL | Status: DC | PRN

## 2013-01-22 MED ORDER — HYDROMORPHONE HCL PF 1 MG/ML IJ SOLN
0.5000 mg | Freq: Once | INTRAMUSCULAR | Status: AC
  Administered 2013-01-22: 0.5 mg via INTRAMUSCULAR
  Filled 2013-01-22: qty 1

## 2013-01-22 NOTE — ED Notes (Signed)
PT. REPORTS PROGRESSING LEFT FOOT PAIN WITH SWELLING , PAIN RADIATING TO LEFT LEG , SEEN AT [facility] Monday DUE TO SNAKE BITE AT LEFT FOOT - RECEIVED TETANUS IMMUNIZATION . PULSE AND SENSATION PRESENT . NO FEVER .

## 2013-01-22 NOTE — ED Notes (Signed)
Pt reports getting bitten by a snake a week ago Monday night on left lateral ankle. Does not know what kind of snake bit her. States ankle was twice as swollen. States pain has now radiated to knee and she is unable to wiggle her small toe. Pt has good pedal pulse and circulation. Pt reports pain on palpation.

## 2013-01-22 NOTE — ED Provider Notes (Signed)
I saw and evaluated the patient, reviewed the resident's note and I agree with the findings and plan.  Pt with healing left lower extremity, foot, due to snake bite envenomation that occurred 1 week ago . Swelling is much improved, currently out of pain medications.  No evidence of continued hemorrhage, no bruising noted elsewhere, healing bruising noted along lower extremity.  Pt is reassured, no evidence of DVT or cellulitis.  Additional analgesics provided.  No CP, SOB.  RRR, no respiratory distress.  Cap refill is normal on left foot.    Impression: Snake bite envenomation, subsequent visit  Gavin Pound. Keyri Salberg, MD 01/22/13 2356

## 2013-01-22 NOTE — ED Provider Notes (Signed)
History    CSN: [id number] Arrival date & time 01/22/13  2055  First MD Initiated Contact with Patient 01/22/13 2142     Chief Complaint  Patient presents with  . Foot Pain  . Snake Bite   (Consider location/radiation/quality/duration/timing/severity/associated sxs/prior Treatment) The patient is a 22 year old female who presents for worsening left ankle pain associated with a snake bite approximately 8 days ago. Patient has no more pain medication and the pain is acutely worsened throughout the day today. Patient is localized to the left ankle on the lateral aspect. Throbbing in character. It radiates proximally. It's worse and with bearing weight and improved with elevation and rest. She denies numbness tingling or weakness of her left lower extremity. She denies easy bruising or bleeding. Patient was seen and treated as an affiliated hospital 8 days ago. She was not given any. Patient received tetanus at this time. She reports improvement swelling but slight worsening of discoloration.  Patient is a 22 y.o. female presenting with lower extremity pain and animal bite.  Foot Pain Associated symptoms include arthralgias (left ankle pain). Pertinent negatives include no abdominal pain, chest pain, chills, coughing, diaphoresis, fever, headaches, myalgias, nausea, sore throat, vomiting or weakness.  Animal Bite Contact animal:  Snake Associated symptoms: no fever    Past Medical History  Diagnosis Date  . Asthma   . Depression    Past Surgical History  Procedure Laterality Date  . Dilation and curettage of uterus    . Wisdom tooth extraction     No family history on file. History  Substance Use Topics  . Smoking status: Current Some Day Smoker -- 0.50 packs/day    Types: Cigarettes  . Smokeless tobacco: Not on file  . Alcohol Use: Yes     Comment: 2-3x/week   OB History   Grav Para Term Preterm Abortions TAB SAB Ect Mult Living                 Review of Systems   Constitutional: Negative for fever, chills, diaphoresis, activity change and appetite change.  HENT: Negative for sore throat, rhinorrhea, sneezing, drooling and trouble swallowing.   Eyes: Negative for discharge and redness.  Respiratory: Negative for cough, chest tightness, shortness of breath, wheezing and stridor.   Cardiovascular: Negative for chest pain and leg swelling.  Gastrointestinal: Negative for nausea, vomiting, abdominal pain, diarrhea, constipation and blood in stool.  Genitourinary: Negative for difficulty urinating.  Musculoskeletal: Positive for arthralgias (left ankle pain). Negative for myalgias.  Skin: Positive for color change (left ankle and foot). Negative for pallor.  Neurological: Negative for dizziness, syncope, speech difficulty, weakness, light-headedness and headaches.  Hematological: Negative for adenopathy. Does not bruise/bleed easily.  Psychiatric/Behavioral: Negative for confusion and agitation.    Allergies  Abilify; Amoxicillin; and Lamictal  Home Medications   Current Outpatient Rx  Name  Route  Sig  Dispense  Refill  . albuterol (PROVENTIL HFA;VENTOLIN HFA) 108 (90 BASE) MCG/ACT inhaler   Inhalation   Inhale 2 puffs into the lungs every 6 (six) hours as needed for wheezing or shortness of breath.          . budesonide-formoterol (SYMBICORT) 160-4.5 MCG/ACT inhaler   Inhalation   Inhale 2 puffs into the lungs 2 (two) times daily.         . clonazePAM (KLONOPIN) 1 MG tablet   Oral   Take 1 mg by mouth daily as needed for anxiety (sleep).          Marland Kitchen  guaifenesin (HUMIBID E) 400 MG TABS   Oral   Take 400 mg by mouth daily as needed. For congestion         . lisdexamfetamine (VYVANSE) 50 MG capsule   Oral   Take 50 mg by mouth daily before breakfast.         . oxyCODONE-acetaminophen (PERCOCET) 5-325 MG per tablet   Oral   Take 1 tablet by mouth every 6 (six) hours as needed for pain.   13 tablet   0   .  HYDROcodone-acetaminophen (NORCO/VICODIN) 5-325 MG per tablet   Oral   Take 1 tablet by mouth every 4 (four) hours as needed for pain.   16 tablet   0    BP 150/95  Pulse 118  Temp(Src) 98.4 F (36.9 C) (Oral)  Resp 16  SpO2 99%  LMP 01/05/2013 Physical Exam  Constitutional: She is oriented to person, place, and time. She appears well-developed and well-nourished. No distress.  HENT:  Head: Normocephalic and atraumatic.  Right Ear: External ear normal.  Left Ear: External ear normal.  Eyes: Conjunctivae and EOM are normal. Right eye exhibits no discharge. Left eye exhibits no discharge.  Neck: Normal range of motion. Neck supple. No JVD present.  Cardiovascular: Normal rate, regular rhythm and normal heart sounds.  Exam reveals no gallop and no friction rub.   No murmur heard. Pulmonary/Chest: Effort normal and breath sounds normal. No stridor. No respiratory distress. She has no wheezes. She has no rales. She exhibits no tenderness.  Abdominal: Soft. Bowel sounds are normal. She exhibits no distension. There is no tenderness. There is no rebound and no guarding.  Musculoskeletal: She exhibits no edema.       Left foot: She exhibits decreased range of motion (due to pain reportedly of ankle), tenderness (lateral aspect over soft tissue) and swelling. She exhibits no crepitus.  Neurological: She is alert and oriented to person, place, and time.  Skin: Skin is warm. Ecchymosis (left ankle which is now proximal to the lines reportedly drawn 8 days ago) noted. She is not diaphoretic.  Psychiatric: She has a normal mood and affect. Her behavior is normal.    ED Course  Procedures (including critical care time) Labs Reviewed  CBC - Abnormal; Notable for the following:    MCHC 36.1 (*)    All other components within normal limits  PROTIME-INR   No results found. 1. Ankle pain, left   2. Snake bite poisoning, subsequent encounter    Results for orders placed during the hospital  encounter of 01/22/13  CBC      Result Value Range   WBC 7.7  4.0 - 10.5 K/uL   RBC 4.56  3.87 - 5.11 MIL/uL   Hemoglobin 14.3  12.0 - 15.0 g/dL   HCT 16.1  09.6 - 04.5 %   MCV 86.8  78.0 - 100.0 fL   MCH 31.4  26.0 - 34.0 pg   MCHC 36.1 (*) 30.0 - 36.0 g/dL   RDW 40.9  81.1 - 91.4 %   Platelets 221  150 - 400 K/uL  PROTIME-INR      Result Value Range   Prothrombin Time 13.4  11.6 - 15.2 seconds   INR 1.04  0.00 - 1.49    MDM  Jaretzy Lhommedieu is a 22 year old female who was bitten by a snake 8 days ago. She did not receive antivenom. She presents for continuing pain of her left ankle and foot. Of note patient no longer  has any more pain medication. She denies numbness tingling, or weakness of her left lower extremity. Swelling and ecchymosis appears improved compared to photos the patient had. Range of motion of the left ankle is intact. Left lower lower extremity is neurovascularly intact. No evidence of underlying infection including cellulitis or abscess. She denies any spontaneous bleeding or easy bruising. White blood cell count is within normal limits. Coags are within normal limits. There is no evidence of abnormal bleeding or any organ damage associated with envenomation. Patient given 1/2 mg of intramuscular Dilaudid for pain control. She was discharged home with a prescription of Norco for pain and instructed to followup with her regular doctor in 2 days as planned for reevaluation. She was given strong return precautions for any worsening pain, numbness or tingling of her left lower extremity, or any other alarming or concerning symptoms or issues. Laboratory data was reviewed. I discussed this patient's care attending Dr. Oletta Lamas.  Sena Hitch, MD 01/22/13 2352

## 2013-08-27 ENCOUNTER — Ambulatory Visit
Admission: RE | Admit: 2013-08-27 | Discharge: 2013-08-27 | Disposition: A | Discharge status: Home / Self Care | Payer: PRIVATE HEALTH INSURANCE | Source: Ambulatory Visit | Attending: Gastroenterology | Admitting: Gastroenterology

## 2013-08-27 ENCOUNTER — Other Ambulatory Visit: Payer: Self-pay | Admitting: Gastroenterology

## 2013-08-27 DIAGNOSIS — T189XXA Foreign body of alimentary tract, part unspecified, initial encounter: Secondary | ICD-10-CM

## 2014-04-17 ENCOUNTER — Emergency Department (HOSPITAL_BASED_OUTPATIENT_CLINIC_OR_DEPARTMENT_OTHER)
Admission: EM | Admit: 2014-04-17 | Discharge: 2014-04-17 | Disposition: A | Discharge status: Home / Self Care | Payer: BC Managed Care – PPO | Attending: Emergency Medicine | Admitting: Emergency Medicine

## 2014-04-17 ENCOUNTER — Encounter (HOSPITAL_BASED_OUTPATIENT_CLINIC_OR_DEPARTMENT_OTHER): Payer: Self-pay | Admitting: Emergency Medicine

## 2014-04-17 DIAGNOSIS — J45909 Unspecified asthma, uncomplicated: Secondary | ICD-10-CM | POA: Insufficient documentation

## 2014-04-17 DIAGNOSIS — Z79899 Other long term (current) drug therapy: Secondary | ICD-10-CM | POA: Diagnosis not present

## 2014-04-17 DIAGNOSIS — R05 Cough: Secondary | ICD-10-CM

## 2014-04-17 DIAGNOSIS — R059 Cough, unspecified: Secondary | ICD-10-CM | POA: Insufficient documentation

## 2014-04-17 DIAGNOSIS — F329 Major depressive disorder, single episode, unspecified: Secondary | ICD-10-CM | POA: Insufficient documentation

## 2014-04-17 DIAGNOSIS — Z880 Allergy status to penicillin: Secondary | ICD-10-CM | POA: Diagnosis not present

## 2014-04-17 DIAGNOSIS — Z87891 Personal history of nicotine dependence: Secondary | ICD-10-CM | POA: Diagnosis not present

## 2014-04-17 DIAGNOSIS — R000 Tachycardia, unspecified: Secondary | ICD-10-CM

## 2014-04-17 DIAGNOSIS — F3289 Other specified depressive episodes: Secondary | ICD-10-CM | POA: Diagnosis not present

## 2014-04-17 DIAGNOSIS — I498 Other specified cardiac arrhythmias: Secondary | ICD-10-CM | POA: Insufficient documentation

## 2014-04-17 NOTE — ED Notes (Signed)
Pt reports chest congestion and cough for several days, stated she was up all night coughing and unable to sleep

## 2014-04-17 NOTE — ED Provider Notes (Signed)
CSN: [id number]     Arrival date & time 04/17/14  1308 History   First MD Initiated Contact with Patient 04/17/14 [phone]     Chief Complaint  Patient presents with  . Cough     (Consider location/radiation/quality/duration/timing/severity/associated sxs/prior Treatment) HPI This is a 23 year old female with a history of asthma. She is here with cough that started yesterday. She states it kept her up all night and she is here requesting a Z-Pak. Although she has an albuterol inhaler she denies using it because she denies any sense of wheezing or shortness of breath. She states she has been using her non-rescue inhaler twice daily as prescribed. She denies having a fever. She does have some nasal congestion and scratchy throat. She denies nausea, vomiting, diarrhea, dysuria, vaginal bleeding or vaginal discharge. She denies polyuria or polydipsia. She states she has not taken her Vyvanse this morning. She admits to drinking a red bull yesterday evening along with 4 beers. She denies illicit drug use. She is not aware of being tachycardic. She is insisting that she must get to work as soon as possible and wishes to decline any diagnostic testing.  Past Medical History  Diagnosis Date  . Asthma   . Depression    Past Surgical History  Procedure Laterality Date  . Dilation and curettage of uterus    . Wisdom tooth extraction     History reviewed. No pertinent family history. History  Substance Use Topics  . Smoking status: Former Smoker -- 0.50 packs/day    Types: Cigarettes  . Smokeless tobacco: Not on file  . Alcohol Use: Yes     Comment: 2-3x/week   OB History   Grav Para Term Preterm Abortions TAB SAB Ect Mult Living                 Review of Systems  All other systems reviewed and are negative.   Allergies  Abilify; Amoxicillin; and Lamictal  Home Medications   Prior to Admission medications   Medication Sig Start Date End Date Taking? Authorizing Provider   beclomethasone (QVAR) 40 MCG/ACT inhaler Inhale 2 puffs into the lungs 2 (two) times daily.   Yes Historical Provider, MD  budesonide-formoterol (SYMBICORT) 160-4.5 MCG/ACT inhaler Inhale 2 puffs into the lungs 2 (two) times daily.   Yes Historical Provider, MD  clonazePAM (KLONOPIN) 1 MG tablet Take 1 mg by mouth daily as needed for anxiety (sleep).    Yes Historical Provider, MD  lisdexamfetamine (VYVANSE) 50 MG capsule Take 50 mg by mouth daily before breakfast.   Yes Historical Provider, MD  albuterol (PROVENTIL HFA;VENTOLIN HFA) 108 (90 BASE) MCG/ACT inhaler Inhale 2 puffs into the lungs every 6 (six) hours as needed for wheezing or shortness of breath.     Historical Provider, MD  guaifenesin (HUMIBID E) 400 MG TABS Take 400 mg by mouth daily as needed. For congestion    Historical Provider, MD  HYDROcodone-acetaminophen (NORCO/VICODIN) 5-325 MG per tablet Take 1 tablet by mouth every 4 (four) hours as needed for pain. 01/22/13   Sena Hitch, MD  oxyCODONE-acetaminophen (PERCOCET) 5-325 MG per tablet Take 1 tablet by mouth every 6 (six) hours as needed for pain. 01/15/13   April K Palumbo-Rasch, MD   BP 131/79  Temp(Src) 98.5 F (36.9 C) (Oral)  Resp 18  Ht  (1.702 m)  Wt 170 lb (77.111 kg)  BMI 26.62 kg/m2  SpO2 99%  Physical Exam General: Well-developed, well-nourished female in no acute distress; appearance consistent  with age of record HENT: normocephalic; atraumatic; TMs normal; mild nasal congestion; mild pharyngeal erythema Eyes: Pupils dilated, sluggish; extraocular muscles intact Neck: supple; no lymphadenopathy Heart: regular rate and rhythm; tachycardic in the 130s to 150s, sinus tachycardia on the monitor Lungs: clear to auscultation bilaterally; occasional cough Abdomen: soft; nondistended; nontender; no masses or hepatosplenomegaly; bowel sounds present Extremities: No deformity; full range of motion; pulses rapid but normal intensity Neurologic: Awake, alert and  oriented; motor function intact in all extremities and symmetric; no facial droop Skin: Warm and dry Psychiatric: Flat affect    ED Course  Procedures (including critical care time)  MDM  The patient refused an EKG or any additional diagnostic testing to evaluate her significant tachycardia. She denied drug use. She denied using her albuterol inhaler. She admitted to red bull and beer yesterday evening as noted above. She was advised that her heart rate was concerningly high but she insisted on leaving so that she can go to work. She was advised that her history and exam did not warrant an antibiotic at this time as her symptoms were likely viral. She was advised that she was welcome to return at any time for further evaluation should she worsen or should she change her mind.    Hanley Seamen, MD 04/17/14 [phone]

## 2014-04-18 ENCOUNTER — Other Ambulatory Visit (HOSPITAL_COMMUNITY)
Admission: RE | Admit: 2014-04-18 | Discharge: 2014-04-18 | Disposition: A | Discharge status: Home / Self Care | Payer: BC Managed Care – PPO | Source: Ambulatory Visit | Attending: Obstetrics and Gynecology | Admitting: Obstetrics and Gynecology

## 2014-04-18 ENCOUNTER — Other Ambulatory Visit: Payer: Self-pay | Admitting: Obstetrics and Gynecology

## 2014-04-18 DIAGNOSIS — Z113 Encounter for screening for infections with a predominantly sexual mode of transmission: Secondary | ICD-10-CM | POA: Diagnosis present

## 2014-04-18 DIAGNOSIS — Z01419 Encounter for gynecological examination (general) (routine) without abnormal findings: Secondary | ICD-10-CM | POA: Insufficient documentation

## 2014-04-22 LAB — CYTOLOGY - PAP

## 2014-04-27 ENCOUNTER — Emergency Department (HOSPITAL_BASED_OUTPATIENT_CLINIC_OR_DEPARTMENT_OTHER)
Admission: EM | Admit: 2014-04-27 | Discharge: 2014-04-27 | Disposition: A | Discharge status: Home / Self Care | Payer: BC Managed Care – PPO | Attending: Emergency Medicine | Admitting: Emergency Medicine

## 2014-04-27 ENCOUNTER — Encounter (HOSPITAL_BASED_OUTPATIENT_CLINIC_OR_DEPARTMENT_OTHER): Payer: Self-pay | Admitting: Emergency Medicine

## 2014-04-27 ENCOUNTER — Emergency Department (HOSPITAL_BASED_OUTPATIENT_CLINIC_OR_DEPARTMENT_OTHER): Payer: BC Managed Care – PPO

## 2014-04-27 DIAGNOSIS — Z880 Allergy status to penicillin: Secondary | ICD-10-CM | POA: Diagnosis not present

## 2014-04-27 DIAGNOSIS — J45998 Other asthma: Secondary | ICD-10-CM | POA: Insufficient documentation

## 2014-04-27 DIAGNOSIS — Z87891 Personal history of nicotine dependence: Secondary | ICD-10-CM | POA: Diagnosis not present

## 2014-04-27 DIAGNOSIS — W1789XA Other fall from one level to another, initial encounter: Secondary | ICD-10-CM | POA: Insufficient documentation

## 2014-04-27 DIAGNOSIS — Y939 Activity, unspecified: Secondary | ICD-10-CM | POA: Diagnosis not present

## 2014-04-27 DIAGNOSIS — Y929 Unspecified place or not applicable: Secondary | ICD-10-CM | POA: Insufficient documentation

## 2014-04-27 DIAGNOSIS — Z7951 Long term (current) use of inhaled steroids: Secondary | ICD-10-CM | POA: Insufficient documentation

## 2014-04-27 DIAGNOSIS — S99912A Unspecified injury of left ankle, initial encounter: Secondary | ICD-10-CM | POA: Diagnosis present

## 2014-04-27 DIAGNOSIS — S80912A Unspecified superficial injury of left knee, initial encounter: Secondary | ICD-10-CM | POA: Insufficient documentation

## 2014-04-27 DIAGNOSIS — F329 Major depressive disorder, single episode, unspecified: Secondary | ICD-10-CM | POA: Insufficient documentation

## 2014-04-27 DIAGNOSIS — Z79899 Other long term (current) drug therapy: Secondary | ICD-10-CM | POA: Diagnosis not present

## 2014-04-27 DIAGNOSIS — M79605 Pain in left leg: Secondary | ICD-10-CM

## 2014-04-27 MED ORDER — IBUPROFEN 800 MG PO TABS: 800.0000 mg | ORAL_TABLET | Freq: Three times a day (TID) | ORAL | Status: DC

## 2014-04-27 MED ORDER — HYDROCODONE-ACETAMINOPHEN 5-325 MG PO TABS: 1.0000 | ORAL_TABLET | ORAL | Status: DC | PRN

## 2014-04-27 MED ORDER — IBUPROFEN 800 MG PO TABS
800.0000 mg | ORAL_TABLET | Freq: Once | ORAL | Status: AC
  Administered 2014-04-27: 800 mg via ORAL
  Filled 2014-04-27: qty 1

## 2014-04-27 MED ORDER — HYDROCODONE-ACETAMINOPHEN 5-325 MG PO TABS
1.0000 | ORAL_TABLET | Freq: Once | ORAL | Status: AC
  Administered 2014-04-27: 1 via ORAL
  Filled 2014-04-27: qty 1

## 2014-04-27 NOTE — ED Provider Notes (Signed)
Medical screening examination/treatment/procedure(s) were performed by non-physician practitioner and as supervising physician I was immediately available for consultation/collaboration.   EKG Interpretation None       Tykee Heideman, MD 04/27/14 1604 

## 2014-04-27 NOTE — ED Notes (Signed)
Patient presents to ED with complaints of left ankle and left knee pain since last night. Pt states she was at a concert and jumped 5 feet landing on her knee and ankle

## 2014-04-27 NOTE — ED Provider Notes (Signed)
CSN: [id number]     Arrival [id number] & time 04/27/14  1354 History   First MD Initiated Contact with Patient 04/27/14 1434     Chief Complaint  Patient presents with  . Ankle Pain     (Consider location/radiation/quality/duration/timing/severity/associated sxs/prior Treatment) Patient is a 23 y.o. female presenting with ankle pain. The history is provided by the patient. No language interpreter was used.  Ankle Pain Location:  Knee, ankle and foot Injury: yes   Knee location:  L knee Ankle location:  L ankle Foot location:  L foot Associated symptoms: no fever   Associated symptoms comment:  She jumped from a 5 foot height last night onto cement causing pain in left knee, ankle and foot with swelling and bruising. She was wearing flat boots at the time. No other injury. She denies hip pain on the left. Pain persisted this morning prompting evaluation.    Past Medical History  Diagnosis Date  . Asthma   . Depression    Past Surgical History  Procedure Laterality Date  . Dilation and curettage of uterus    . Wisdom tooth extraction     No family history on file. History  Substance Use Topics  . Smoking status: Former Smoker -- 0.50 packs/day    Types: Cigarettes  . Smokeless tobacco: Not on file  . Alcohol Use: Yes     Comment: 2-3x/week   OB History   Grav Para Term Preterm Abortions TAB SAB Ect Mult Living                 Review of Systems  Constitutional: Negative for fever and chills.  Respiratory: Negative.   Cardiovascular: Negative.   Gastrointestinal: Negative.   Musculoskeletal:       See HPI.  Skin: Positive for color change. Negative for wound.  Neurological: Negative.  Negative for weakness and numbness.      Allergies  Abilify; Amoxicillin; and Lamictal  Home Medications   Prior to Admission medications   Medication Sig Start Date End Date Taking? Authorizing Provider  albuterol (PROVENTIL HFA;VENTOLIN HFA) 108 (90 BASE) MCG/ACT inhaler Inhale 2  puffs into the lungs every 6 (six) hours as needed for wheezing or shortness of breath.     Historical Provider, MD  beclomethasone (QVAR) 40 MCG/ACT inhaler Inhale 2 puffs into the lungs 2 (two) times daily.    Historical Provider, MD  budesonide-formoterol (SYMBICORT) 160-4.5 MCG/ACT inhaler Inhale 2 puffs into the lungs 2 (two) times daily.    Historical Provider, MD  clonazePAM (KLONOPIN) 1 MG tablet Take 1 mg by mouth daily as needed for anxiety (sleep).     Historical Provider, MD  guaifenesin (HUMIBID E) 400 MG TABS Take 400 mg by mouth daily as needed. For congestion    Historical Provider, MD  HYDROcodone-acetaminophen (NORCO/VICODIN) 5-325 MG per tablet Take 1 tablet by mouth every 4 (four) hours as needed for pain. 01/22/13   Sena HitchStephen Lozier, MD  lisdexamfetamine (VYVANSE) 50 MG capsule Take 50 mg by mouth daily before breakfast.    Historical Provider, MD  oxyCODONE-acetaminophen (PERCOCET) 5-325 MG per tablet Take 1 tablet by mouth every 6 (six) hours as needed for pain. 01/15/13   April K Palumbo-Rasch, MD   BP 125/81  Pulse 125  Temp(Src) 98.1 F (36.7 C) (Oral)  Resp 14  Ht 5\' 7"  (1.702 m)  Wt 170 lb (77.111 kg)  BMI 26.62 kg/m2  SpO2 100% Physical Exam  Constitutional: She is oriented to person, place, and time.  She appears well-developed and well-nourished.  Neck: Normal range of motion.  Cardiovascular: Normal rate.   Pulmonary/Chest: Effort normal.  Musculoskeletal:  Left knee is without swelling or effusion. Joint stable. Tenderness across lower anterior joint coinciding with finding of bruise. No bony deformity. No thigh or calf tenderness. Left ankle swollen laterally with mild ecchymosis. Joint stable without deformity. Achilles intact. Left foot minimally swollen, tender over dorsomedial aspect. FROM all digits.   Neurological: She is alert and oriented to person, place, and time.  Skin: Skin is warm and dry.  Psychiatric: She has a normal mood and affect.    ED  Course  Procedures (including critical care time) Labs Review Labs Reviewed - No data to display  Imaging Review Dg Ankle Complete Left  04/27/2014   CLINICAL DATA:  Left ankle pain anteriorly and laterally post fall.  EXAM: LEFT ANKLE COMPLETE - 3+ VIEW  COMPARISON:  07/24/2011  FINDINGS: Mild soft tissue swelling medially and laterally. Ankle mortise is intact. No acute fracture or dislocation. Mild pes cavus deformity.  IMPRESSION: No acute fracture.   Electronically Signed   By: Elberta Fortis M.D.   On: 04/27/2014 14:31   Dg Knee Complete 4 Views Left  04/27/2014   CLINICAL DATA:  Left knee pain post fall last night.  Pain medially.  EXAM: LEFT KNEE - COMPLETE 4+ VIEW  COMPARISON:  None.  FINDINGS: There is no evidence of fracture, dislocation, or joint effusion. There is no evidence of arthropathy or other focal bone abnormality. Soft tissues are unremarkable.  IMPRESSION: Negative.   Electronically Signed   By: Elberta Fortis M.D.   On: 04/27/2014 14:34   Dg Foot Complete Left  04/27/2014   CLINICAL DATA:  Fall last night with left foot injury dorsally and injury to first toe.  EXAM: LEFT FOOT - COMPLETE 3+ VIEW  COMPARISON:  07/24/2011  FINDINGS: Examination demonstrates mild pes cavus deformity unchanged. There is no acute fracture or dislocation.  IMPRESSION: No acute findings.   Electronically Signed   By: Elberta Fortis M.D.   On: 04/27/2014 14:33     EKG Interpretation None      MDM   Final diagnoses:  None    1. Lower extremity pain   No bony abnormalities suspected on exam and confirmed on imaging. Knee immobilizer, ASO provided. She has crutches for weight bearing as tolerated status. Referral to Dr. Pearletha Forge for repeat exam if pain persists.     Arnoldo Hooker, PA-C 04/27/14 1506

## 2014-04-27 NOTE — Discharge Instructions (Signed)
Cryotherapy Cryotherapy is when you put ice on your injury. Ice helps lessen pain and puffiness (swelling) after an injury. Ice works the best when you start using it in the first 24 to 48 hours after an injury. HOME CARE  Put a dry or damp towel between the ice pack and your skin.  You may press gently on the ice pack.  Leave the ice on for no more than 10 to 20 minutes at a time.  Check your skin after 5 minutes to make sure your skin is okay.  Rest at least 20 minutes between ice pack uses.  Stop using ice when your skin loses feeling (numbness).  Do not use ice on someone who cannot tell you when it hurts. This includes small children and people with memory problems (dementia). GET HELP RIGHT AWAY IF:  You have white spots on your skin.  Your skin turns blue or pale.  Your skin feels waxy or hard.  Your puffiness gets worse. MAKE SURE YOU:   Understand these instructions.  Will watch your condition.  Will get help right away if you are not doing well or get worse. Document Released: 12/28/2007 Document Revised: 10/03/2011 Document Reviewed: 03/03/2011 [employer] Patient Information 2015 [employer]. This information is not intended to replace advice given to you by your health care provider. Make sure you discuss any questions you have with your health care provider.  

## 2014-05-01 ENCOUNTER — Ambulatory Visit: Payer: Self-pay | Admitting: Family Medicine

## 2014-05-02 ENCOUNTER — Ambulatory Visit (INDEPENDENT_AMBULATORY_CARE_PROVIDER_SITE_OTHER): Payer: BC Managed Care – PPO | Admitting: Family Medicine

## 2014-05-02 ENCOUNTER — Encounter: Payer: Self-pay | Admitting: Family Medicine

## 2014-05-02 VITALS — BP 123/84 | HR 118 | Ht 66.0 in | Wt 170.0 lb

## 2014-05-02 DIAGNOSIS — S99912A Unspecified injury of left ankle, initial encounter: Secondary | ICD-10-CM

## 2014-05-02 DIAGNOSIS — S8992XA Unspecified injury of left lower leg, initial encounter: Secondary | ICD-10-CM

## 2014-05-02 MED ORDER — HYDROCODONE-ACETAMINOPHEN 5-325 MG PO TABS: 1.0000 | ORAL_TABLET | Freq: Four times a day (QID) | ORAL | Status: DC | PRN

## 2014-05-02 NOTE — Patient Instructions (Signed)
You have an ankle sprain. Ice the area for 15 minutes at a time, 3-4 times a day Ibuprofen 800mg  three times a day with food. Elevate above the level of your heart when possible Crutches if needed to help with walking Bear weight when tolerated Use laceup ankle brace to help with stability while you recover from this injury. Come out of brace twice a day to do Up/down and alphabet exercises 2-3 sets of each. Consider physical therapy for strengthening and balance exercises in the future.  Your exam is consistent with a medial meniscus tear of your knee. Wear knee brace when up and walking around. Icing 15 minutes at a time 3-4 times a day. Ibuprofen 800mg  three times a day with food for pain and inflammation for next 2 weeks. Crutches as needed. Start knee extensions, straight leg raises, quad sets 3 sets of 10 once a day.  Add ankle weight if these become too easy. Follow up with me in 2 weeks for both issues.

## 2014-05-07 ENCOUNTER — Encounter: Payer: Self-pay | Admitting: Family Medicine

## 2014-05-07 DIAGNOSIS — S99912A Unspecified injury of left ankle, initial encounter: Secondary | ICD-10-CM | POA: Insufficient documentation

## 2014-05-07 DIAGNOSIS — S8992XA Unspecified injury of left lower leg, initial encounter: Secondary | ICD-10-CM | POA: Insufficient documentation

## 2014-05-07 NOTE — Assessment & Plan Note (Signed)
consistent with a medial meniscus tear.  Knee brace for support.  Start home exercise program.  Icing, nsaids.  F/u in 2 weeks.

## 2014-05-07 NOTE — Progress Notes (Signed)
Patient ID: Diana Ochoa, female   DOB: 12/27/1990, 23 y.o.   MRN: [id number]  PCP: Delanna NoticePICKETT,KATHERINE, PA-C  Subjective:   HPI: Patient is a 23 y.o. female here for left knee and ankle injuries.  Patient reports on 10/3 she jumped over a railing and landed wrong on a concrete floor. Landed on lateral aspect of ankle then down onto her knee. Felt like she twisted knee as well. Unable to put full weight down. Had radiographs of left foot, ankle, knee that were negative for fracture. Wearing ankle brace. No prior history of knee or ankle injuries. Given immobilizer for left knee also.  Past Medical History  Diagnosis Date  . Asthma   . Depression   . ADD (attention deficit disorder)     Current Outpatient Prescriptions on File Prior to Visit  Medication Sig Dispense Refill  . albuterol (PROVENTIL HFA;VENTOLIN HFA) 108 (90 BASE) MCG/ACT inhaler Inhale 2 puffs into the lungs every 6 (six) hours as needed for wheezing or shortness of breath.       . clonazePAM (KLONOPIN) 1 MG tablet Take 1 mg by mouth daily as needed for anxiety (sleep).       . lisdexamfetamine (VYVANSE) 50 MG capsule Take 50 mg by mouth daily before breakfast.       No current facility-administered medications on file prior to visit.    Past Surgical History  Procedure Laterality Date  . Dilation and curettage of uterus    . Wisdom tooth extraction      Allergies  Allergen Reactions  . Abilify [Aripiprazole] Anxiety  . Amoxicillin Rash  . Lamictal [Lamotrigine] Rash    History   Social History  . Marital Status: Single    Spouse Name: N/A    Number of Children: N/A  . Years of Education: N/A   Occupational History  . Not on file.   Social History Main Topics  . Smoking status: Former Smoker -- 0.50 packs/day    Types: Cigarettes  . Smokeless tobacco: Not on file  . Alcohol Use: Yes     Comment: 2-3x/week  . Drug Use: No  . Sexual Activity: No   Other Topics Concern  . Not on file    Social History Narrative  . No narrative on file    No family history on file.  BP 123/84  Pulse 118  Ht 5\' 6"  (1.676 m)  Wt 170 lb (77.111 kg)  BMI 27.45 kg/m2  Review of Systems: See HPI above.    Objective:  Physical Exam:  Gen: NAD  Left ankle: No gross deformity, swelling, ecchymoses Mild limitation all directions. TTP greatest over ATFL.  No malleolus, base 5th, navicular tenderness. 2+ ant drawer and talar tilt.  Talar tilt painful. Negative syndesmotic compression. Thompsons test negative. NV intact distally.  Left knee: Mild effusion.  No bruising, other deformity. TTP medial joint line.  No other tenderness. FROM. Negative ant/post drawers. Negative valgus/varus testing. Negative lachmanns. Positive mcmurrays and apleys.  Negative patellar apprehension, clarkes. NV intact distally.    Assessment & Plan:  1. Left ankle injury - 2/2 sprain.  Radiographs reassuring.  Icing, ibuprofen.  ASO for stability.  Elevation.  Start ROM exercises.  Consider physical therapy for strengthening.  2. Left knee injury - consistent with a medial meniscus tear.  Knee brace for support.  Start home exercise program.  Icing, nsaids.  F/u in 2 weeks.

## 2014-05-07 NOTE — Assessment & Plan Note (Signed)
2/2 sprain.  Radiographs reassuring.  Icing, ibuprofen.  ASO for stability.  Elevation.  Start ROM exercises.  Consider physical therapy for strengthening.

## 2014-05-16 ENCOUNTER — Ambulatory Visit (INDEPENDENT_AMBULATORY_CARE_PROVIDER_SITE_OTHER): Payer: BC Managed Care – PPO | Admitting: Family Medicine

## 2014-05-16 ENCOUNTER — Encounter: Payer: Self-pay | Admitting: Family Medicine

## 2014-05-16 VITALS — BP 126/79 | HR 67 | Ht 67.0 in | Wt 180.0 lb

## 2014-05-16 DIAGNOSIS — S8992XD Unspecified injury of left lower leg, subsequent encounter: Secondary | ICD-10-CM

## 2014-05-16 DIAGNOSIS — S99912D Unspecified injury of left ankle, subsequent encounter: Secondary | ICD-10-CM

## 2014-05-16 MED ORDER — TRAMADOL HCL 50 MG PO TABS: 50.0000 mg | ORAL_TABLET | Freq: Three times a day (TID) | ORAL | Status: DC | PRN

## 2014-05-16 NOTE — Patient Instructions (Addendum)
Continue with home exercises for both your knee and your ankle. Call me if you want to do physical therapy. Ibuprofen as needed for pain and inflammation. Tramadol as needed for severe pain (no driving on this). Continue wearing knee brace for support. Follow up with me in 4 weeks for reevaluation.

## 2014-05-21 ENCOUNTER — Encounter: Payer: Self-pay | Admitting: Family Medicine

## 2014-05-21 ENCOUNTER — Ambulatory Visit: Payer: Self-pay | Admitting: Family Medicine

## 2014-05-21 NOTE — Assessment & Plan Note (Signed)
2/2 sprain.  Resolved.  Continue with home exercises for this.  Icing, ibuprofen if needed.  ASO for stability.

## 2014-05-21 NOTE — Assessment & Plan Note (Signed)
consistent with a medial meniscus tear.  Knee brace for support.  Ibuprofen, with tramadol as needed.  She will call us if she wants to try physical therapy.  If not improving when she is 6 weeks out from this injury will go ahead with MRI to confirm tear, consider arthroscopy.

## 2014-05-21 NOTE — Progress Notes (Signed)
Patient ID: Diana Ochoa, female   DOB: 03-01-1991, 23 y.o.   MRN: [id number]  PCP: Delanna NoticePICKETT,KATHERINE, PA-C  Subjective:   HPI: Patient is a 23 y.o. female here for left knee and ankle injuries.  10/9: Patient reports on 10/3 she jumped over a railing and landed wrong on a concrete floor. Landed on lateral aspect of ankle then down onto her knee. Felt like she twisted knee as well. Unable to put full weight down. Had radiographs of left foot, ankle, knee that were negative for fracture. Wearing ankle brace. No prior history of knee or ankle injuries. Given immobilizer for left knee also.  10/23: Patient reports ankle has improved now. Left knee only a little better - pain up to 6/10 level at times. No swelling. Worse when walking, getting out of car, with prolonged standing. Wearing knee brace, doing home exercise program, icing as needed. No catching, locking, giving out.  Past Medical History  Diagnosis Date  . Asthma   . Depression   . ADD (attention deficit disorder)     Current Outpatient Prescriptions on File Prior to Visit  Medication Sig Dispense Refill  . albuterol (PROVENTIL HFA;VENTOLIN HFA) 108 (90 BASE) MCG/ACT inhaler Inhale 2 puffs into the lungs every 6 (six) hours as needed for wheezing or shortness of breath.       . clonazePAM (KLONOPIN) 1 MG tablet Take 1 mg by mouth daily as needed for anxiety (sleep).       Marland Kitchen. escitalopram (LEXAPRO) 5 MG tablet Take by mouth.      . fluticasone-salmeterol (ADVAIR HFA) 115-21 MCG/ACT inhaler Inhale into the lungs.      Marland Kitchen. lisdexamfetamine (VYVANSE) 50 MG capsule Take 50 mg by mouth daily before breakfast.       No current facility-administered medications on file prior to visit.    Past Surgical History  Procedure Laterality Date  . Dilation and curettage of uterus    . Wisdom tooth extraction      Allergies  Allergen Reactions  . Abilify [Aripiprazole] Anxiety  . Amoxicillin Rash  . Lamictal [Lamotrigine] Rash     History   Social History  . Marital Status: Single    Spouse Name: N/A    Number of Children: N/A  . Years of Education: N/A   Occupational History  . Not on file.   Social History Main Topics  . Smoking status: Former Smoker -- 0.50 packs/day    Types: Cigarettes  . Smokeless tobacco: Not on file  . Alcohol Use: Yes     Comment: 2-3x/week  . Drug Use: No  . Sexual Activity: No   Other Topics Concern  . Not on file   Social History Narrative  . No narrative on file    No family history on file.  BP 126/79  Pulse 67  Ht 5\' 7"  (1.702 m)  Wt 180 lb (81.647 kg)  BMI 28.19 kg/m2  Review of Systems: See HPI above.    Objective:  Physical Exam:  Gen: NAD  Left knee: Mild effusion.  No bruising, other deformity. TTP medial joint line.  No other tenderness. FROM. Negative ant/post drawers. Negative valgus/varus testing. Negative lachmanns. Positive mcmurrays and apleys.  Negative patellar apprehension, clarkes. NV intact distally.    Assessment & Plan:  1. Left ankle injury - 2/2 sprain.  Resolved.  Continue with home exercises for this.  Icing, ibuprofen if needed.  ASO for stability.  2. Left knee injury - consistent with a medial meniscus tear.  Knee brace for support.  Ibuprofen, with tramadol as needed.  She will call us if she wants to try physical therapy.  If not improving when she is 6 weeks out from this injury will go ahead with MRI to confirm tear, consider arthroscopy.

## 2014-06-24 ENCOUNTER — Telehealth: Payer: Self-pay | Admitting: Family Medicine

## 2014-06-24 ENCOUNTER — Other Ambulatory Visit: Payer: Self-pay | Admitting: *Deleted

## 2014-06-24 MED ORDER — TRAMADOL HCL 50 MG PO TABS: 50.0000 mg | ORAL_TABLET | Freq: Three times a day (TID) | ORAL | Status: DC | PRN

## 2014-06-24 NOTE — Telephone Encounter (Signed)
Ok to refill once - 60 tablets, no refills.  Thanks!

## 2014-07-28 ENCOUNTER — Telehealth: Payer: Self-pay | Admitting: Family Medicine

## 2014-07-28 NOTE — Telephone Encounter (Signed)
As noted in last phone note - we would only refill this once.  She will have to use non-narcotic measures for pain control.  If her knee is still bothering her we discussed an MRI or formal physical therapy as well.

## 2014-07-31 ENCOUNTER — Ambulatory Visit: Payer: BLUE CROSS/BLUE SHIELD | Admitting: Family Medicine

## 2014-08-12 ENCOUNTER — Telehealth: Payer: Self-pay | Admitting: Family Medicine

## 2014-08-12 NOTE — Telephone Encounter (Signed)
Really?  It may be something different - at her last appointment 3 months ago her foot/ankle pain was gone and her knee was bothering her.  We should evaluate her first.  Thanks!

## 2014-08-18 ENCOUNTER — Encounter: Payer: Self-pay | Admitting: Family Medicine

## 2014-08-18 ENCOUNTER — Ambulatory Visit (INDEPENDENT_AMBULATORY_CARE_PROVIDER_SITE_OTHER): Payer: BLUE CROSS/BLUE SHIELD | Admitting: Family Medicine

## 2014-08-18 VITALS — BP 139/97 | HR 128 | Ht 67.0 in | Wt 180.0 lb

## 2014-08-18 DIAGNOSIS — M79672 Pain in left foot: Secondary | ICD-10-CM

## 2014-08-18 NOTE — Patient Instructions (Signed)
You have severe peroneal tendinitis. Ice the area 15 minutes at a time 3-4 times a day. Meloxicam 15mg  daily with food (consider voltaren instead - call me if you want to try this). Do home exercises 3 sets of 10 once a day - external rotation and calf raises especially. Let me know if you want to do physical therapy or the nitro patches also. Follow up with me in 5-6 weeks. Consider MRI if not improving also.

## 2014-08-20 DIAGNOSIS — M79672 Pain in left foot: Secondary | ICD-10-CM | POA: Insufficient documentation

## 2014-08-20 NOTE — Assessment & Plan Note (Signed)
consistent with peroneal tendinopathy.  Icing, nsaids with home exercise program.  Discussed regular arch support as well, avoid barefoot walking.  She will call us if she wants to do physical therapy or nitro patches.  She asked about pain medication - advised this is not an appropriate indication for narcotics.  F/u in 5-6 weeks.

## 2014-08-20 NOTE — Progress Notes (Signed)
PCP: PICKETT,KATHERINE, PA-C  Subjective:   HPI: Patient is a 24 y.o. female here for left foot pain.  Patient reports this is a new issue. Having pain on lateral aspect of foot, ankle. Slight swelling. No known injury. Does stand and walk a lot at work. Unable to run, exercise due to pain. Has not been doing home exercises.  Past Medical History  Diagnosis Date  . Asthma   . Depression   . ADD (attention deficit disorder)     Current Outpatient Prescriptions on File Prior to Visit  Medication Sig Dispense Refill  . albuterol (PROVENTIL HFA;VENTOLIN HFA) 108 (90 BASE) MCG/ACT inhaler Inhale 2 puffs into the lungs every 6 (six) hours as needed for wheezing or shortness of breath.     . fluticasone-salmeterol (ADVAIR HFA) 115-21 MCG/ACT inhaler Inhale into the lungs.    Marland Kitchen. lisdexamfetamine (VYVANSE) 50 MG capsule Take 50 mg by mouth daily before breakfast.    . traMADol (ULTRAM) 50 MG tablet Take 1 tablet (50 mg total) by mouth every 8 (eight) hours as needed. 60 tablet 0   No current facility-administered medications on file prior to visit.    Past Surgical History  Procedure Laterality Date  . Dilation and curettage of uterus    . Wisdom tooth extraction      Allergies  Allergen Reactions  . Abilify [Aripiprazole] Anxiety  . Amoxicillin Rash  . Lamictal [Lamotrigine] Rash    History   Social History  . Marital Status: Single    Spouse Name: N/A    Number of Children: N/A  . Years of Education: N/A   Occupational History  . Not on file.   Social History Main Topics  . Smoking status: Former Smoker -- 0.50 packs/day    Types: Cigarettes  . Smokeless tobacco: Not on file  . Alcohol Use: 0.0 oz/week    0 Not specified per week     Comment: 2-3x/week  . Drug Use: No  . Sexual Activity: No   Other Topics Concern  . Not on file   Social History Narrative    No family history on file.  BP 139/97 mmHg  Pulse 128  Ht 5\' 7"  (1.702 m)  Wt 180 lb (81.647  kg)  BMI 28.19 kg/m2  Review of Systems: See HPI above.    Objective:  Physical Exam:  Gen: NAD  Left foot/ankle: No gross deformity, swelling, ecchymoses FROM with pain on external rotation especially. TTP course of peroneal tendons and 5th metatarsal.  No other tenderness. Negative ant drawer and talar tilt.   Negative syndesmotic compression. Thompsons test negative. NV intact distally.  MSK u/s:  No evidence cortical irregularity, edema, neovascularity to suggest stress fracture of 5th metatarsal.  No target sign of peroneal tendons and no visible tears.    Assessment & Plan:  1. Left foot/ankle pain - consistent with peroneal tendinopathy.  Icing, nsaids with home exercise program.  Discussed regular arch support as well, avoid barefoot walking.  She will call us if she wants to do physical therapy or nitro patches.  She asked about pain medication - advised this is not an appropriate indication for narcotics.  F/u in 5-6 weeks.

## 2014-09-15 ENCOUNTER — Other Ambulatory Visit: Payer: Self-pay | Admitting: Family Medicine

## 2015-08-10 ENCOUNTER — Encounter (HOSPITAL_BASED_OUTPATIENT_CLINIC_OR_DEPARTMENT_OTHER): Payer: Self-pay | Admitting: *Deleted

## 2015-08-10 ENCOUNTER — Emergency Department (HOSPITAL_BASED_OUTPATIENT_CLINIC_OR_DEPARTMENT_OTHER)
Admission: EM | Admit: 2015-08-10 | Discharge: 2015-08-10 | Disposition: A | Discharge status: Home / Self Care | Payer: BLUE CROSS/BLUE SHIELD | Attending: Emergency Medicine | Admitting: Emergency Medicine

## 2015-08-10 ENCOUNTER — Emergency Department (HOSPITAL_BASED_OUTPATIENT_CLINIC_OR_DEPARTMENT_OTHER): Payer: BLUE CROSS/BLUE SHIELD

## 2015-08-10 DIAGNOSIS — R002 Palpitations: Secondary | ICD-10-CM | POA: Diagnosis present

## 2015-08-10 DIAGNOSIS — Z87891 Personal history of nicotine dependence: Secondary | ICD-10-CM | POA: Diagnosis not present

## 2015-08-10 DIAGNOSIS — F909 Attention-deficit hyperactivity disorder, unspecified type: Secondary | ICD-10-CM | POA: Insufficient documentation

## 2015-08-10 DIAGNOSIS — Z7951 Long term (current) use of inhaled steroids: Secondary | ICD-10-CM | POA: Diagnosis not present

## 2015-08-10 DIAGNOSIS — Z79899 Other long term (current) drug therapy: Secondary | ICD-10-CM | POA: Diagnosis not present

## 2015-08-10 DIAGNOSIS — R109 Unspecified abdominal pain: Secondary | ICD-10-CM | POA: Insufficient documentation

## 2015-08-10 DIAGNOSIS — R079 Chest pain, unspecified: Secondary | ICD-10-CM | POA: Diagnosis not present

## 2015-08-10 DIAGNOSIS — Z880 Allergy status to penicillin: Secondary | ICD-10-CM | POA: Insufficient documentation

## 2015-08-10 DIAGNOSIS — F419 Anxiety disorder, unspecified: Secondary | ICD-10-CM | POA: Diagnosis not present

## 2015-08-10 DIAGNOSIS — R000 Tachycardia, unspecified: Secondary | ICD-10-CM | POA: Insufficient documentation

## 2015-08-10 DIAGNOSIS — F329 Major depressive disorder, single episode, unspecified: Secondary | ICD-10-CM | POA: Diagnosis not present

## 2015-08-10 DIAGNOSIS — R110 Nausea: Secondary | ICD-10-CM | POA: Diagnosis not present

## 2015-08-10 DIAGNOSIS — J45909 Unspecified asthma, uncomplicated: Secondary | ICD-10-CM | POA: Insufficient documentation

## 2015-08-10 DIAGNOSIS — R350 Frequency of micturition: Secondary | ICD-10-CM | POA: Insufficient documentation

## 2015-08-10 LAB — BASIC METABOLIC PANEL
Anion gap: 9 (ref 5–15)
BUN: 9 mg/dL (ref 6–20)
CALCIUM: 9.3 mg/dL (ref 8.9–10.3)
CO2: 26 mmol/L (ref 22–32)
Chloride: 103 mmol/L (ref 101–111)
Creatinine, Ser: 0.62 mg/dL (ref 0.44–1.00)
GFR calc Af Amer: >60 mL/min (ref >60)
GLUCOSE: 104 mg/dL — AB (ref 65–99)
Potassium: 3.7 mmol/L (ref 3.5–5.1)
Sodium: 138 mmol/L (ref 135–145)

## 2015-08-10 LAB — CBC WITH DIFFERENTIAL/PLATELET
Basophils Absolute: 0 10*3/uL (ref 0.0–0.1)
Basophils Relative: 0 %
Eosinophils Absolute: 0.1 10*3/uL (ref 0.0–0.7)
Eosinophils Relative: 1 %
HCT: 40.7 % (ref 36.0–46.0)
HEMOGLOBIN: 14.1 g/dL (ref 12.0–15.0)
LYMPHS PCT: 26 %
Lymphs Abs: 1.9 10*3/uL (ref 0.7–4.0)
MCH: 29.6 pg (ref 26.0–34.0)
MCHC: 34.6 g/dL (ref 30.0–36.0)
MCV: 85.5 fL (ref 78.0–100.0)
MONOS PCT: 5 %
Monocytes Absolute: 0.4 10*3/uL (ref 0.1–1.0)
NEUTROS ABS: 5 10*3/uL (ref 1.7–7.7)
NEUTROS PCT: 68 %
PLATELETS: 258 10*3/uL (ref 150–400)
RBC: 4.76 MIL/uL (ref 3.87–5.11)
RDW: 11.6 % (ref 11.5–15.5)
WBC: 7.3 10*3/uL (ref 4.0–10.5)

## 2015-08-10 LAB — URINALYSIS, ROUTINE W REFLEX MICROSCOPIC
Bilirubin Urine: NEGATIVE
GLUCOSE, UA: NEGATIVE mg/dL
HGB URINE DIPSTICK: NEGATIVE
Ketones, ur: NEGATIVE mg/dL
Leukocytes, UA: NEGATIVE
Nitrite: NEGATIVE
PH: 7 (ref 5.0–8.0)
PROTEIN: NEGATIVE mg/dL
Specific Gravity, Urine: 1.004 — ABNORMAL LOW (ref 1.005–1.030)

## 2015-08-10 LAB — TROPONIN I: Troponin I: <0.03 ng/mL (ref <0.031)

## 2015-08-10 MED ORDER — ONDANSETRON HCL 4 MG/2ML IJ SOLN
4.0000 mg | Freq: Once | INTRAMUSCULAR | Status: AC
  Administered 2015-08-10: 4 mg via INTRAVENOUS

## 2015-08-10 MED ORDER — ONDANSETRON HCL 4 MG/2ML IJ SOLN
INTRAMUSCULAR | Status: AC
Start: 2015-08-10 — End: 2015-08-10
  Administered 2015-08-10: 4 mg via INTRAVENOUS
  Filled 2015-08-10: qty 2

## 2015-08-10 MED ORDER — SODIUM CHLORIDE 0.9 % IV BOLUS (SEPSIS)
1000.0000 mL | Freq: Once | INTRAVENOUS | Status: AC
  Administered 2015-08-10: 1000 mL via INTRAVENOUS

## 2015-08-10 MED ORDER — ALPRAZOLAM 0.5 MG PO TABS
0.5000 mg | ORAL_TABLET | Freq: Once | ORAL | Status: AC
  Administered 2015-08-10: 0.5 mg via ORAL
  Filled 2015-08-10: qty 1

## 2015-08-10 NOTE — ED Notes (Signed)
Pt placed on automatic vital signs Q30. 

## 2015-08-10 NOTE — Discharge Instructions (Signed)
Continue taking your medications as prescribed.  Follow up with your primary care provider in 2-3 days. I also recommend following up with Cardiology clinic listed above for outpatient follow up regarding your palpitations. Please return to the Emergency Department if symptoms worsen or new onset of fever, difficulty breathing, chest pain, lightheadedness, dizziness, headache, visual changes, cough, numbness, tingling, weakness, syncope, seizure.

## 2015-08-10 NOTE — ED Provider Notes (Signed)
CSN: [id number]     Arrival date & time 08/10/15  1314 History   First MD Initiated Contact with Patient 08/10/15 1423     Chief Complaint  Patient presents with  . Palpitations     (Consider location/radiation/quality/duration/timing/severity/associated sxs/prior Treatment) HPI   Patient is a 25 year old female past medical history of asthma, ADD, anxiety and depression who presents to the ED with complaint of palpitations, onset 10 AM. Patient reports while she was at work she began feeling like her heart was "racing and fluttering". She notes she has had similar episodes over the past 6 months and notes she typically will have 1-2 episodes a week. She notes episodes last approximately 5 seconds and then resolves spontaneously after taking a deep breath however she notes her episode of palpitations today did not resolve resulting her coming to the ED. Patient also endorses having midsternal and left-sided chest pain which she notes she started 30 minutes prior to arrival. She describes the pain as constant and sharp and notes that rubbing her chest alleviates the pain. Patient also endorses having a burning pain to bilateral thoracic/flank regions. Endorses associated urinary frequency and nausea. Denies fever, chills, headache, visual changes, lightheadedness, dizziness, cough, shortness of breath, wheezing, abdominal pain, vomiting and diarrhea, dysuria, numbness, tingling, weakness, syncope. Endorses drinking 1/2 cup of coffee this morning. Denies taking diet pills or any other form of caffeine. She states she took her Vyvanse and zoloft this morning but notes she did not take her xanax.    Past Medical History  Diagnosis Date  . Asthma   . Depression   . ADD (attention deficit disorder)    Past Surgical History  Procedure Laterality Date  . Dilation and curettage of uterus    . Wisdom tooth extraction     No family history on file. Social History  Substance Use Topics  . Smoking  status: Former Smoker -- 0.50 packs/day    Types: Cigarettes  . Smokeless tobacco: None  . Alcohol Use: 0.0 oz/week    0 Standard drinks or equivalent per week     Comment: 2-3x/week   OB History    No data available     Review of Systems  Cardiovascular: Positive for chest pain and palpitations.  Gastrointestinal: Positive for nausea.  Genitourinary: Positive for frequency and flank pain.  All other systems reviewed and are negative.     Allergies  Abilify; Amoxicillin; and Lamictal  Home Medications   Prior to Admission medications   Medication Sig Start Date End Date Taking? Authorizing Provider  ALPRAZolam (XANAX PO) Take by mouth.   Yes Historical Provider, MD  GABAPENTIN, ONCE-DAILY, PO Take by mouth.   Yes Historical Provider, MD  sertraline (ZOLOFT) 50 MG tablet Take 50 mg by mouth daily.   Yes Historical Provider, MD  TRAZODONE HCL PO Take by mouth.   Yes Historical Provider, MD  albuterol (PROVENTIL HFA;VENTOLIN HFA) 108 (90 BASE) MCG/ACT inhaler Inhale 2 puffs into the lungs every 6 (six) hours as needed for wheezing or shortness of breath.     Historical Provider, MD  busPIRone (BUSPAR) 10 MG tablet Take 10 mg by mouth 2 (two) times daily. 08/06/14   Historical Provider, MD  DULoxetine (CYMBALTA) 60 MG capsule  08/06/14   Historical Provider, MD  fluticasone-salmeterol (ADVAIR HFA) 115-21 MCG/ACT inhaler Inhale into the lungs.    Historical Provider, MD  lisdexamfetamine (VYVANSE) 50 MG capsule Take 50 mg by mouth daily before breakfast.    Historical Provider,  MD  LORazepam (ATIVAN) 0.5 MG tablet  07/02/14   Historical Provider, MD  traMADol (ULTRAM) 50 MG tablet Take 1 tablet (50 mg total) by mouth every 8 (eight) hours as needed. 06/24/14   Lenda KelpShane R Hudnall, MD   BP 109/97 mmHg  Pulse 103  Temp(Src) 98.1 F (36.7 C) (Oral)  Resp 16  Ht 5\' 7"  (1.702 m)  Wt 77.111 kg  BMI 26.62 kg/m2  SpO2 97% Physical Exam  Constitutional: She is oriented to person, place,  and time. She appears well-developed and well-nourished.  Pt appears mildly anxious.  HENT:  Head: Normocephalic and atraumatic.  Mouth/Throat: Oropharynx is clear and moist. No oropharyngeal exudate.  Eyes: Conjunctivae and EOM are normal. Right eye exhibits no discharge. Left eye exhibits no discharge. No scleral icterus.  Neck: Normal range of motion. Neck supple.  Cardiovascular: Regular rhythm, normal heart sounds and intact distal pulses.   Tachycardic, HR 120  Pulmonary/Chest: Effort normal and breath sounds normal. No respiratory distress. She has no wheezes. She has no rales. She exhibits no tenderness.  Abdominal: Soft. Bowel sounds are normal. She exhibits no distension and no mass. There is no tenderness. There is CVA tenderness. There is no rebound and no guarding.  Bilateral flanks tender with light palpation.  Musculoskeletal: Normal range of motion. She exhibits no edema.  No midline C/T/L tenderness. FROM of neck and back.  Lymphadenopathy:    She has no cervical adenopathy.  Neurological: She is alert and oriented to person, place, and time. She has normal strength. No sensory deficit.  Skin: Skin is warm and dry.  Nursing note and vitals reviewed.   ED Course  Procedures (including critical care time) Labs Review Labs Reviewed  BASIC METABOLIC PANEL - Abnormal; Notable for the following:    Glucose, Bld 104 (*)    All other components within normal limits  URINALYSIS, ROUTINE W REFLEX MICROSCOPIC (NOT AT [facility]) - Abnormal; Notable for the following:    Specific Gravity, Urine 1.004 (*)    All other components within normal limits  CBC WITH DIFFERENTIAL/PLATELET  TROPONIN I    Imaging Review Dg Chest 2 View  08/10/2015  CLINICAL DATA:  Chest pain and palpitations EXAM: CHEST  2 VIEW COMPARISON:  08/27/2013 chest radiograph. FINDINGS: Stable cardiomediastinal silhouette with normal heart size. No pneumothorax. No pleural effusion. Lungs appear clear, with no acute  consolidative airspace disease and no pulmonary edema. IMPRESSION: No active cardiopulmonary disease. Electronically Signed   By: Delbert PhenixJason A Poff M.D.   On: 08/10/2015 15:20   I have personally reviewed and evaluated these images and lab results as part of my medical decision-making.   EKG Interpretation   Date/Time:  Monday August 10 2015 13:25:18 EST Ventricular Rate:  124 PR Interval:  116 QRS Duration: 76 QT Interval:  334 QTC Calculation: 479 R Axis:   53 Text Interpretation:  Sinus tachycardia with Premature atrial complexes  Otherwise normal ECG No old tracing to compare Confirmed by BELFI  MD,  MELANIE ([id number]) on 08/10/2015 3:46:56 PM      MDM   Final diagnoses:  Palpitations    Pt presents with palpitations and associated CP and nausea. Endorses also having bilateral flank pain and urinary frequency. Hx of asthma, anxiety and depression. Pt endorses drinking 1/2 cup coffee this morning and taking her Vyvanse and Zoloft this morning. Denies near-syncopal sxs. HR 120, normotensive, afebrile. On exam pt appeared mildly anxious. Exam revealed mild tenderness with light palpation to bilateral flanks, no  midline tenderness. No back pain red flags. Abdominal exam benign. EKG showed sinus tachycardia with premature atrial complexes, no old tracing to compare. Trop negative. Labs and UA unremarkable. CXR negative. Pt given IVF and dose of Xanax. On reexamination pt reports she is feeling better and her CP has resolved. I have a low suspicion for ACS, PE, dissection, or other acute cardiac event at this time. Plan to d/c pt home with outpatient cardiology follow up regarding palpitations and tachycardia.   Evaluation does not show pathology requring ongoing emergent intervention or admission. Pt is hemodynamically stable and mentating appropriately. Discussed findings/results and plan with patient/guardian, who agrees with plan. All questions answered. Return precautions discussed and  outpatient follow up given.        Satira Sark Tiro, [place] 08/10/15 1636  Rolan Bucco, MD 08/10/15 [phone]

## 2015-08-10 NOTE — ED Notes (Addendum)
Palpitations since yesterday. Makes her anxious. It went away without intervention yesterday and came back at 10 am.

## 2017-06-23 ENCOUNTER — Emergency Department (HOSPITAL_BASED_OUTPATIENT_CLINIC_OR_DEPARTMENT_OTHER): Payer: Self-pay

## 2017-06-23 ENCOUNTER — Emergency Department (HOSPITAL_COMMUNITY)
Admission: EM | Admit: 2017-06-23 | Discharge: 2017-06-23 | Discharge status: Against Medical Advice | Payer: BLUE CROSS/BLUE SHIELD

## 2017-06-23 ENCOUNTER — Encounter (HOSPITAL_BASED_OUTPATIENT_CLINIC_OR_DEPARTMENT_OTHER): Payer: Self-pay | Admitting: *Deleted

## 2017-06-23 ENCOUNTER — Other Ambulatory Visit: Payer: Self-pay

## 2017-06-23 ENCOUNTER — Emergency Department (HOSPITAL_BASED_OUTPATIENT_CLINIC_OR_DEPARTMENT_OTHER)
Admission: EM | Admit: 2017-06-23 | Discharge: 2017-06-23 | Disposition: A | Discharge status: Home / Self Care | Payer: Self-pay | Attending: Emergency Medicine | Admitting: Emergency Medicine

## 2017-06-23 DIAGNOSIS — Z87891 Personal history of nicotine dependence: Secondary | ICD-10-CM | POA: Insufficient documentation

## 2017-06-23 DIAGNOSIS — J45909 Unspecified asthma, uncomplicated: Secondary | ICD-10-CM | POA: Insufficient documentation

## 2017-06-23 DIAGNOSIS — R0981 Nasal congestion: Secondary | ICD-10-CM | POA: Insufficient documentation

## 2017-06-23 DIAGNOSIS — R05 Cough: Secondary | ICD-10-CM

## 2017-06-23 DIAGNOSIS — Z79899 Other long term (current) drug therapy: Secondary | ICD-10-CM | POA: Insufficient documentation

## 2017-06-23 DIAGNOSIS — R058 Other specified cough: Secondary | ICD-10-CM

## 2017-06-23 DIAGNOSIS — J209 Acute bronchitis, unspecified: Secondary | ICD-10-CM | POA: Insufficient documentation

## 2017-06-23 DIAGNOSIS — M7918 Myalgia, other site: Secondary | ICD-10-CM | POA: Insufficient documentation

## 2017-06-23 DIAGNOSIS — R509 Fever, unspecified: Secondary | ICD-10-CM | POA: Insufficient documentation

## 2017-06-23 MED ORDER — ALBUTEROL SULFATE (2.5 MG/3ML) 0.083% IN NEBU
5.0000 mg | INHALATION_SOLUTION | Freq: Once | RESPIRATORY_TRACT | Status: DC
  Filled 2017-06-23: qty 6

## 2017-06-23 MED ORDER — IPRATROPIUM BROMIDE 0.02 % IN SOLN
0.5000 mg | Freq: Once | RESPIRATORY_TRACT | Status: DC
  Filled 2017-06-23: qty 2.5

## 2017-06-23 MED ORDER — AZITHROMYCIN 250 MG PO TABS: ORAL_TABLET | ORAL | 0 refills | Status: DC

## 2017-06-23 MED FILL — AZITHROMYCIN 250 MG TABLET: 250 | 5 days supply | Qty: 6 | Fill #0

## 2017-06-23 NOTE — ED Notes (Signed)
No response when called from lobby to triage. 

## 2017-06-23 NOTE — ED Notes (Signed)
[facility]; pt there trying to check in.
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
## 2017-06-23 NOTE — Discharge Instructions (Signed)
It was our pleasure to provide your ER care today - we hope that you feel better.  Take antibiotic as prescribed.  Use albuterol inhaler as need.   Avoid any smoking.   Take mucinex or robitussin as need for cough/congestion.  Follow up with primary care doctor in 1 week if symptoms fail to improve/resolve.  Return to ER if worse, new symptoms, increased trouble breathing, other concern.

## 2017-06-23 NOTE — ED Triage Notes (Signed)
Cough x 10 days. Body aches, chills, fever.

## 2017-06-23 NOTE — ED Notes (Signed)
Refused neb tx, ED MD informed

## 2017-06-23 NOTE — ED Notes (Signed)
Pt refused breathing tx. States I really dont need that. MD made aware.

## 2017-07-08 ENCOUNTER — Other Ambulatory Visit: Payer: Self-pay

## 2017-07-08 ENCOUNTER — Encounter (HOSPITAL_BASED_OUTPATIENT_CLINIC_OR_DEPARTMENT_OTHER): Payer: Self-pay | Admitting: *Deleted

## 2017-07-08 ENCOUNTER — Emergency Department (HOSPITAL_BASED_OUTPATIENT_CLINIC_OR_DEPARTMENT_OTHER)
Admission: EM | Admit: 2017-07-08 | Discharge: 2017-07-08 | Disposition: A | Discharge status: Home / Self Care | Payer: No Typology Code available for payment source | Attending: Physician Assistant | Admitting: Physician Assistant

## 2017-07-08 DIAGNOSIS — Y999 Unspecified external cause status: Secondary | ICD-10-CM | POA: Diagnosis not present

## 2017-07-08 DIAGNOSIS — J45909 Unspecified asthma, uncomplicated: Secondary | ICD-10-CM | POA: Diagnosis not present

## 2017-07-08 DIAGNOSIS — Y929 Unspecified place or not applicable: Secondary | ICD-10-CM | POA: Diagnosis not present

## 2017-07-08 DIAGNOSIS — Z87891 Personal history of nicotine dependence: Secondary | ICD-10-CM | POA: Diagnosis not present

## 2017-07-08 DIAGNOSIS — M25562 Pain in left knee: Secondary | ICD-10-CM | POA: Diagnosis not present

## 2017-07-08 DIAGNOSIS — Y939 Activity, unspecified: Secondary | ICD-10-CM | POA: Diagnosis not present

## 2017-07-08 MED ORDER — CYCLOBENZAPRINE HCL 5 MG PO TABS: 5.0000 mg | ORAL_TABLET | Freq: Three times a day (TID) | ORAL | 0 refills | Status: AC | PRN

## 2017-07-08 NOTE — Discharge Instructions (Signed)
Please return with any concerns.  Any numbness tingling weakness headaches or other concerns.
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
## 2018-04-24 DEATH — deceased

## 2018-05-25 DEATH — deceased

## 2019-07-15 IMAGING — CR DG CHEST 2V
2 series · 2 of 2 positions shown · non-contrast
Comparison: 08/10/2015

CLINICAL DATA: Cough

EXAM:
CHEST  2 VIEW

[w chest pa]
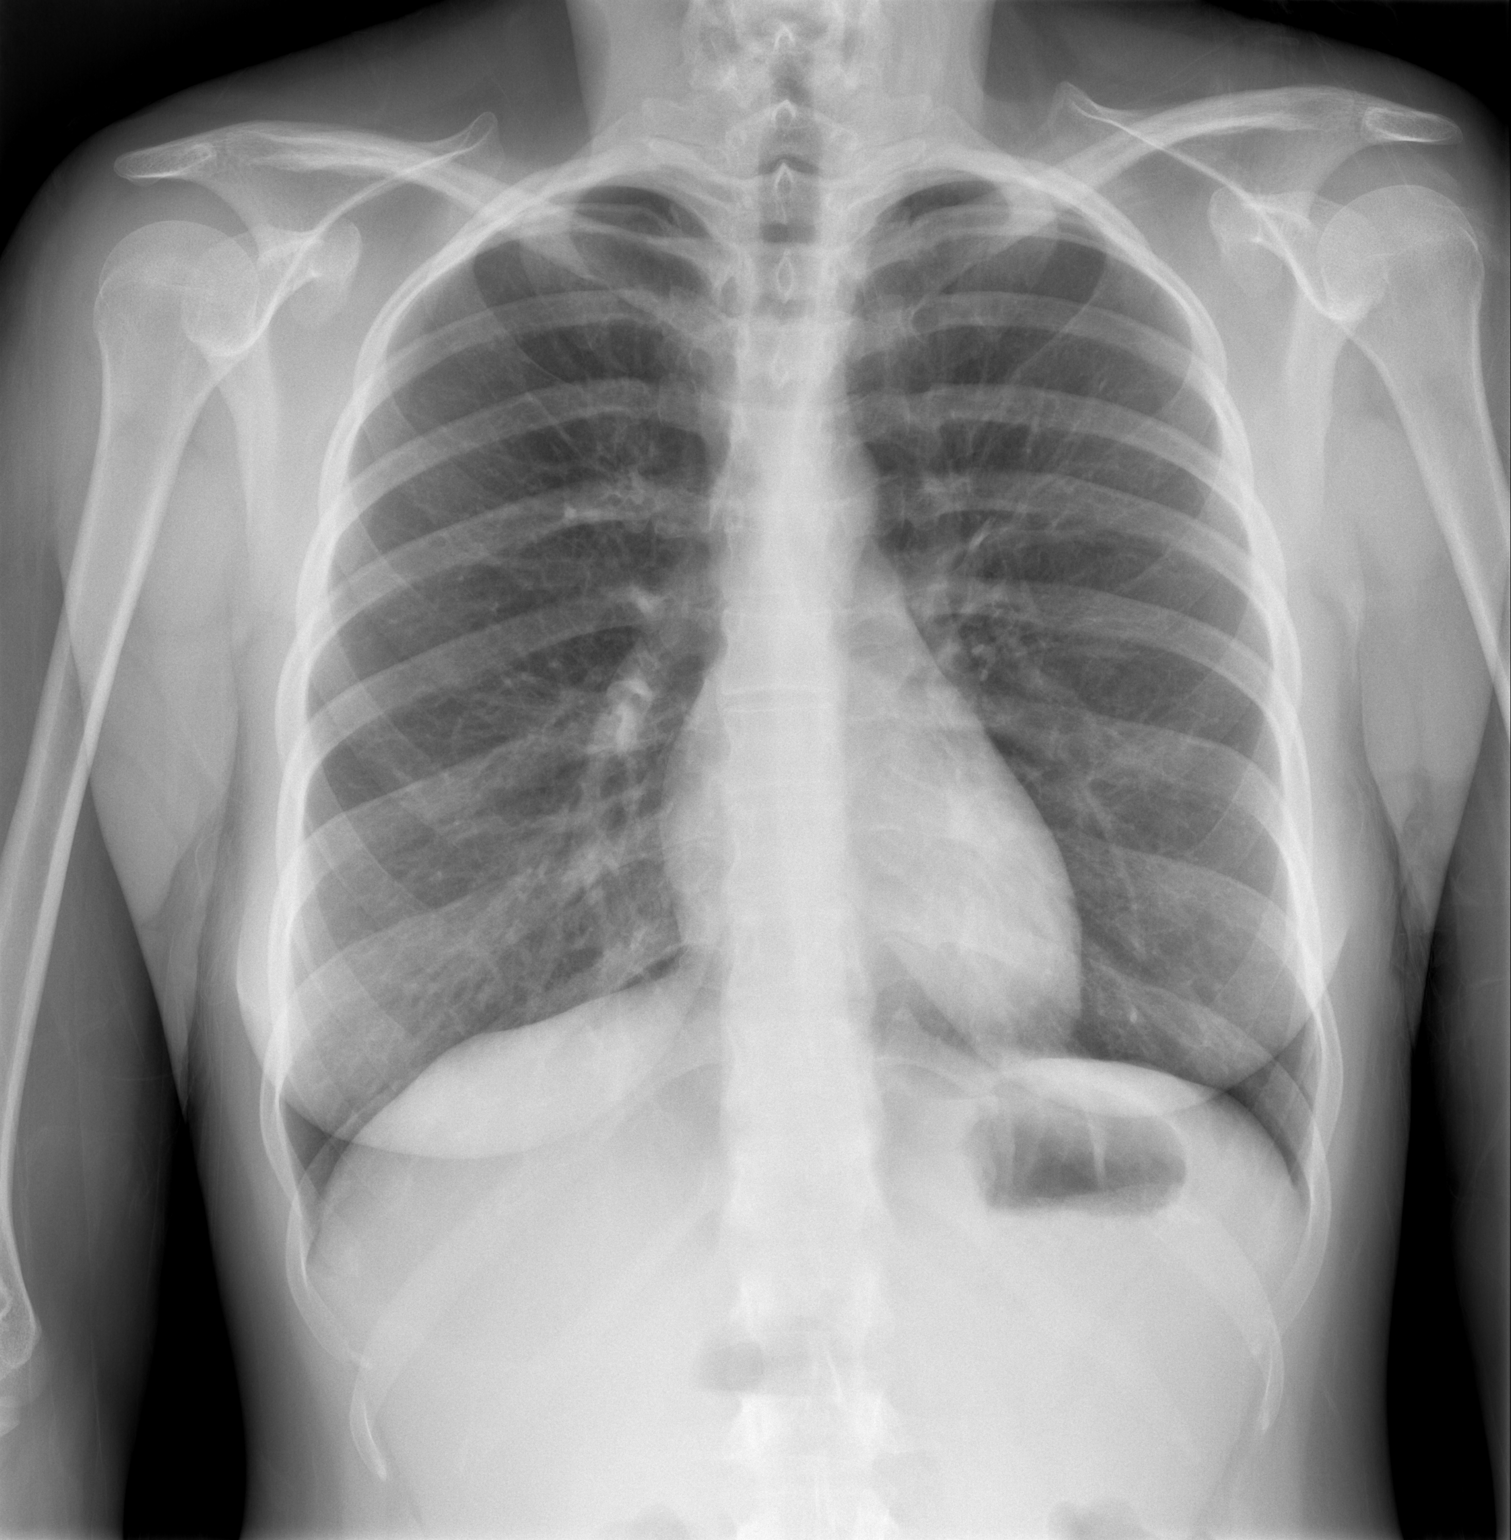

[w chest lat]
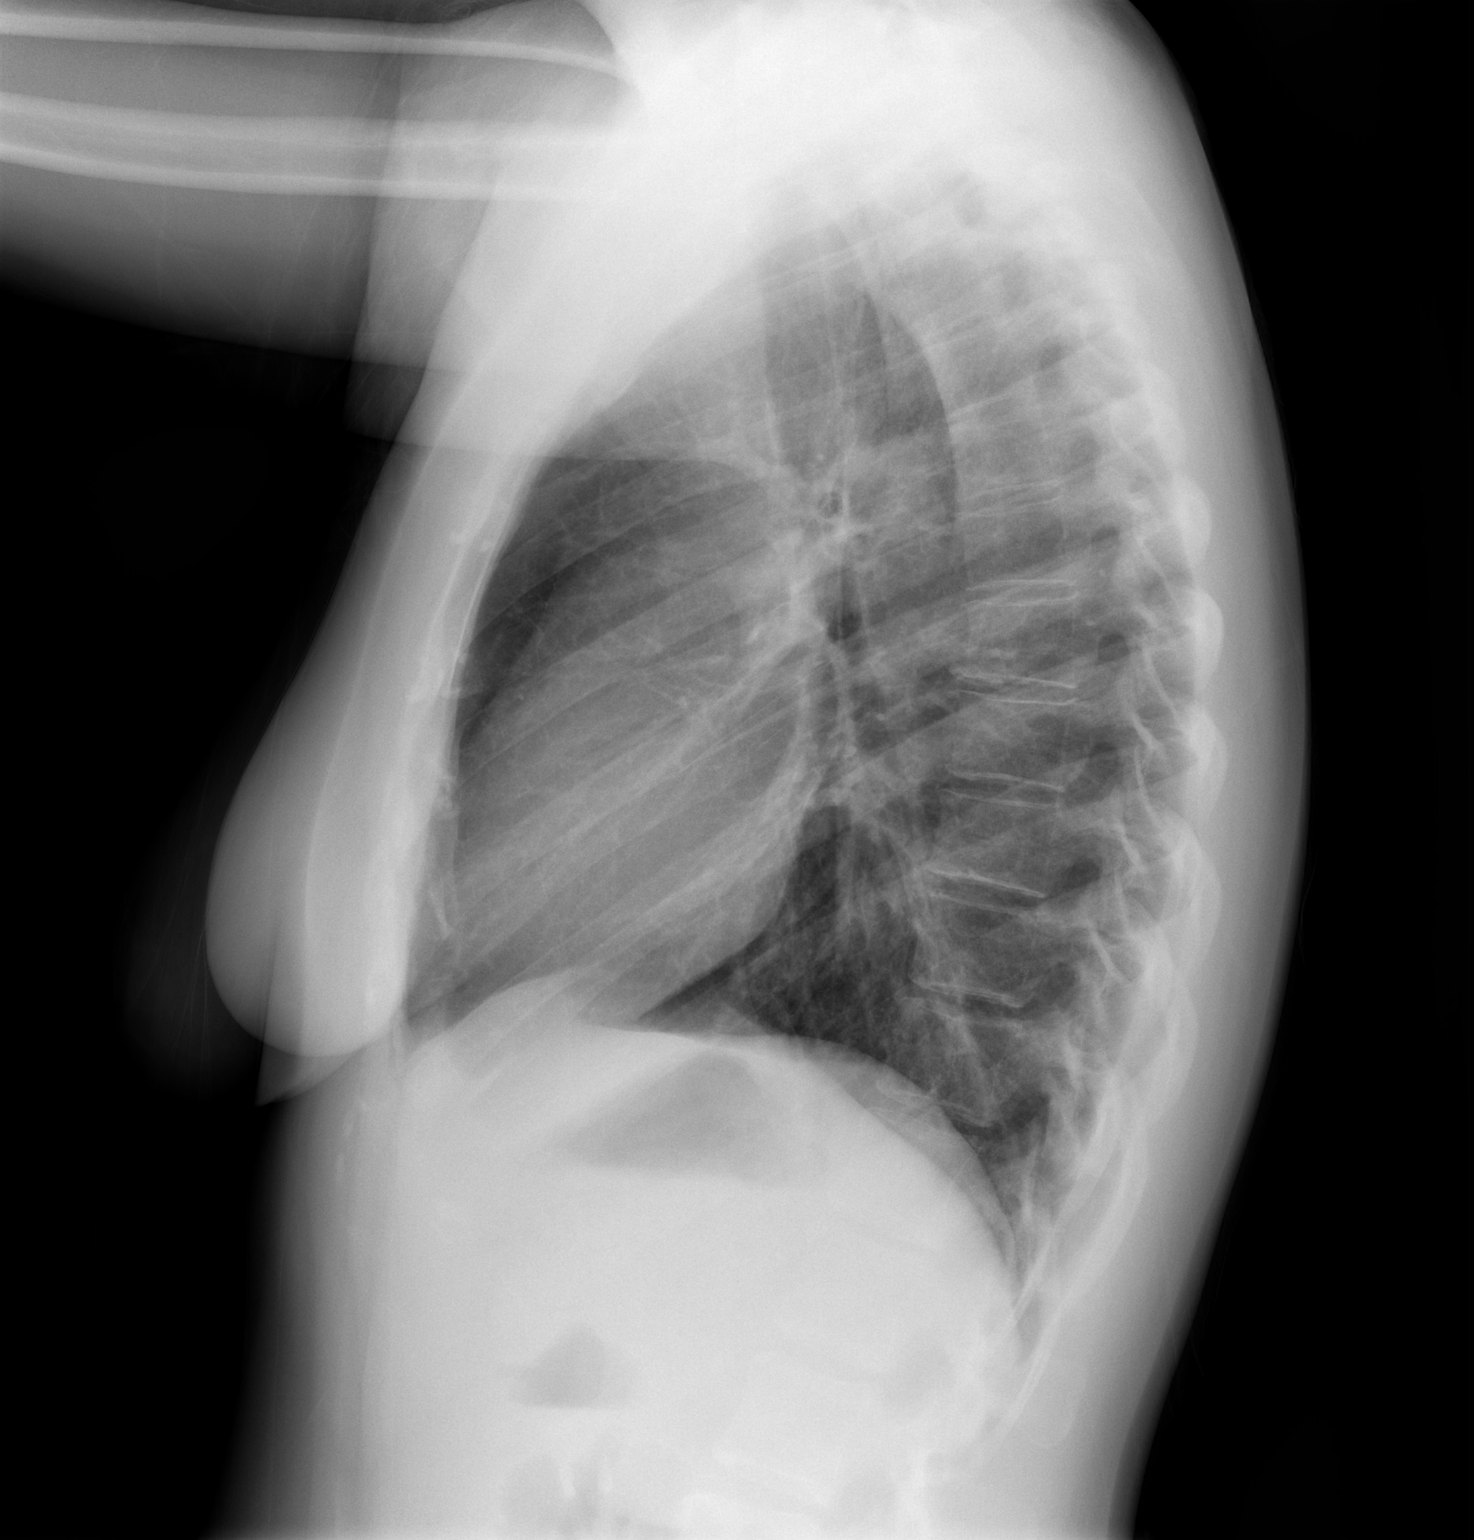

[2 of 2 positions shown; findings below may reference images not displayed]

FINDINGS: The heart size and mediastinal contours are within normal limits.
Both lungs are clear. The visualized skeletal structures are
unremarkable.
IMPRESSION: No active cardiopulmonary disease.
